# Patient Record
Sex: Female | Born: 1955 | Race: White | Hispanic: No | Smoking: Former smoker
Health system: Southern US, Community
[De-identification: ages and names within clinical notes are randomized; demographics above are authoritative.]

## PROBLEM LIST (undated history)

## (undated) DIAGNOSIS — F419 Anxiety disorder, unspecified: Secondary | ICD-10-CM

## (undated) HISTORY — PX: CHOLECYSTECTOMY: SHX55

## (undated) HISTORY — PX: APPENDECTOMY: SHX54

## (undated) HISTORY — PX: KNEE CARTILAGE SURGERY: SHX688

## (undated) HISTORY — PX: ABDOMINAL HYSTERECTOMY: SHX81

## (undated) HISTORY — DX: Anxiety disorder, unspecified: F41.9

---

## 2006-11-27 ENCOUNTER — Ambulatory Visit (HOSPITAL_COMMUNITY): Admission: RE | Admit: 2006-11-27 | Discharge: 2006-11-27 | Payer: Self-pay | Admitting: Orthopaedic Surgery

## 2010-07-31 NOTE — H&P (Signed)
Alison White, Alison White                ACCOUNT NO.:  [id number]   MEDICAL RECORD NO.:  [id number]          PATIENT TYPE:  AMB   LOCATION:  DAY                           FACILITY:  [facility]   PHYSICIAN:  J. Darreld Mclean, M.D. DATE OF BIRTH:  [birth year]   DATE OF ADMISSION:  DATE OF DISCHARGE:  LH                              HISTORY & PHYSICAL   CHIEF COMPLAINT:  I've got an ulcer on the bottom of my knee.   The patient is a 51-year-one female who I first saw in my office on  September 5 complaining of a small loose body on top of her patellar on  the left knee.  It has been present for several months, not getting any  better.  She would like to have it excised.  It bothers her when she  tries to squat or bend.  There has been no history of trauma.   PAST MEDICAL HISTORY:  Negative.   ALLERGIES:  She has no allergies.   MEDICATIONS:  She takes no medications.   HABITS:  Does not smoke.  Does not use alcohol.   PAST SURGICAL HISTORY:  She is status post hysterectomy,  cholecystectomy.   FAMILY HISTORY:  She lists no diseases that run in the family.   SOCIAL HISTORY:  She lives in [place] and is not married.   PHYSICAL EXAMINATION:  VITAL SIGNS:  BP 118/74, pulse 72, respirations  16, afebrile, height 5 feet 1 inch, 185 pounds.  GENERAL:  The patient is alert, cooperative, and oriented.  HEENT:  Negative.  NECK:  Supple.  LUNGS:  Clear to P&A.  HEART:  Regular without murmur.  ABDOMEN:  Soft, nontender without masses.  EXTREMITIES:  Gait is normal.  Range of motion of the left knee  suspicious for a small grape-sized, easily movable lesion on top of the  patella in the prepatellar area.  It is more toward the lateral side and  has a tendency to go proximal.  Extremities negative.  CNS:  Intact.  SKIN:  Intact.   IMPRESSION:  Small loose body in the prepatellar space left knee.   PLAN:  Excision of loose body.   I have discussed with the patient plan and procedure, risks,  and  imponderables.  She appears to understand and agrees to proceed as  underlined.  Labs are pending.                                            ______________________________  J. Darreld Mclean, M.D.     JWK/MEDQ  D:  11/26/2006  T:  11/26/2006  Job:  [id number]

## 2010-07-31 NOTE — Op Note (Signed)
NAMERENNAE, FERRAIOLO                ACCOUNT NO.:  [id number]   MEDICAL RECORD NO.:  [id number]          PATIENT TYPE:  AMB   LOCATION:  DAY                           FACILITY:  [facility]   PHYSICIAN:  J. Darreld Mclean, M.D. DATE OF BIRTH:  1955/07/07   DATE OF PROCEDURE:  11/27/2006  DATE OF DISCHARGE:                               OPERATIVE REPORT   PREOPERATIVE DIAGNOSIS:  Loose body, left knee, prepatellar area.   POSTOPERATIVE DIAGNOSIS:  Loose body, left knee, prepatellar area.   PROCEDURE:  Excision loose body left knee prepatellar area.   ANESTHESIA:  General.   SURGEON:  J. Darreld Mclean, M.D.   TOURNIQUET TIME:  3 minutes.   DRAINS:  None.   INDICATIONS FOR PROCEDURE:  The patient is a 55 year old female with a  small loose body in the prepatellar area of her knee that rolls around  and causes pain and tenderness, especially when she bends her knee.  She  has not improved with conservative treatment and I recommended excision.  The risks and imponderables of the procedure were discussed  preoperatively.  She appeared to understand.   DESCRIPTION OF PROCEDURE:  The patient identified the left knee as the  correct surgical site and we felt the loose body.  She was brought to  the operating room and given a general anesthetic.  A tourniquet was  placed deflated on the left upper thigh. The patient was prepped and  draped in the usual manner.  A time out identifying Ms. Belfield as the  patient.  The tourniquet was inflated.  The loose body was felt and a  small incision was made over this.  The loose body was then removed. It  was very small and sent to pathology.  The wound was reapproximated  using skin staples.  The tourniquet was deflated at 3 minutes.  A  sterile dressing was applied.  A bulky dressing was applied.  A  prescription was given for Vicodin ES for pain.  I will see her in the  office in approximately 10 days to two weeks.  If she has difficulties,  she is to  contact me at the office or hospital beeper system.           ______________________________  Shela Commons. Darreld Mclean, M.D.     JWK/MEDQ  D:  11/27/2006  T:  11/27/2006  Job:  [id number]

## 2010-12-28 LAB — DIFFERENTIAL
Eosinophils Absolute: 0.1
Eosinophils Relative: 2
Lymphs Abs: 1.3

## 2010-12-28 LAB — COMPREHENSIVE METABOLIC PANEL
ALT: 42 — ABNORMAL HIGH
AST: 36
Alkaline Phosphatase: 85
CO2: 26
Chloride: 107
GFR calc Af Amer: >60
GFR calc non Af Amer: >60
Potassium: 4.1
Sodium: 141
Total Bilirubin: 0.7

## 2010-12-28 LAB — URINALYSIS, ROUTINE W REFLEX MICROSCOPIC
Glucose, UA: NEGATIVE
Ketones, ur: NEGATIVE
Leukocytes, UA: NEGATIVE
Protein, ur: NEGATIVE

## 2010-12-28 LAB — CBC
RBC: 4.08
WBC: 6

## 2013-03-22 ENCOUNTER — Encounter (INDEPENDENT_AMBULATORY_CARE_PROVIDER_SITE_OTHER): Payer: Self-pay

## 2013-03-22 ENCOUNTER — Encounter: Payer: Self-pay | Admitting: General Practice

## 2013-03-22 ENCOUNTER — Ambulatory Visit (INDEPENDENT_AMBULATORY_CARE_PROVIDER_SITE_OTHER): Payer: 59 | Admitting: General Practice

## 2013-03-22 VITALS — BP 143/86 | HR 77 | Temp 98.5°F | Ht 60.0 in | Wt 174.0 lb

## 2013-03-22 DIAGNOSIS — R1032 Left lower quadrant pain: Secondary | ICD-10-CM

## 2013-03-22 MED ORDER — KETOROLAC TROMETHAMINE 60 MG/2ML IM SOLN
60.0000 mg | Freq: Once | INTRAMUSCULAR | Status: AC
  Administered 2013-03-22: 60 mg via INTRAMUSCULAR

## 2013-03-22 NOTE — Progress Notes (Signed)
   Subjective:    Patient ID: Alison White, female    DOB: 12/24/1955, 58 y.o.   MRN: [id number]  Abdominal Pain This is a new problem. The current episode started in the past 7 days. The onset quality is sudden. The problem occurs constantly. The problem has been unchanged. The pain is located in the LUQ. The pain is at a severity of 6/10. The pain is moderate. The quality of the pain is aching. The abdominal pain does not radiate. Pertinent negatives include no belching, constipation, diarrhea, dysuria, fever, flatus, frequency, hematuria, nausea or vomiting. The pain is aggravated by certain positions and movement. The pain is relieved by nothing. She has tried oral narcotic analgesics for the symptoms.  Patient was seen at [facility] on 03/18/13 for low back pain and diagnosed with acute pyelonephritis. She was given IV antibiotics during the visit. Reports some relief for 24 hours and now left abdominal pain has developed.     Review of Systems  Constitutional: Negative for fever and chills.  Respiratory: Negative for chest tightness and shortness of breath.   Cardiovascular: Negative for chest pain and palpitations.  Gastrointestinal: Positive for abdominal pain. Negative for nausea, vomiting, diarrhea, constipation and flatus.  Genitourinary: Negative for dysuria, frequency, hematuria and difficulty urinating.  All other systems reviewed and are negative.       Objective:   Physical Exam  Constitutional: She is oriented to person, place, and time. She appears well-developed and well-nourished.  Cardiovascular: Normal rate, regular rhythm and normal heart sounds.   Pulmonary/Chest: Effort normal and breath sounds normal. No respiratory distress. She exhibits no tenderness.  Abdominal: Soft. Bowel sounds are normal. There is tenderness.  Tenderness to left upper lateral abdomen, with swelling.   Neurological: She is alert and oriented to person, place, and time.  Skin: Skin is  warm and dry.  Psychiatric: She has a normal mood and affect.          Assessment & Plan:  1. Abdominal pain, left lower quadrant - US Abdomen Complete; Future (scheduled for today, after leaving office) -Patient verbalized understanding and in agreement with having [place] of abdomen  - ketorolac (TORADOL) injection 60 mg; Inject 2 mLs (60 mg total) into the muscle once. Erby Pian, FNP-C

## 2013-03-22 NOTE — Patient Instructions (Addendum)

## 2013-03-23 ENCOUNTER — Telehealth: Payer: Self-pay | Admitting: General Practice

## 2013-03-24 NOTE — Telephone Encounter (Signed)
Patient talked to [place] last night and mae they discussed with her

## 2013-03-26 ENCOUNTER — Ambulatory Visit (INDEPENDENT_AMBULATORY_CARE_PROVIDER_SITE_OTHER): Payer: 59

## 2013-03-26 ENCOUNTER — Telehealth: Payer: Self-pay | Admitting: General Practice

## 2013-03-26 ENCOUNTER — Ambulatory Visit (INDEPENDENT_AMBULATORY_CARE_PROVIDER_SITE_OTHER): Payer: 59 | Admitting: General Practice

## 2013-03-26 ENCOUNTER — Encounter: Payer: Self-pay | Admitting: General Practice

## 2013-03-26 VITALS — BP 143/83 | HR 61 | Temp 99.9°F | Wt 175.0 lb

## 2013-03-26 DIAGNOSIS — R1012 Left upper quadrant pain: Secondary | ICD-10-CM

## 2013-03-26 DIAGNOSIS — K5900 Constipation, unspecified: Secondary | ICD-10-CM

## 2013-03-26 LAB — POCT CBC
Granulocyte percent: 69.1 %G (ref 37–80)
HCT, POC: 42.4 % (ref 37.7–47.9)
HEMOGLOBIN: 13 g/dL (ref 12.2–16.2)
Lymph, poc: 1.7 (ref 0.6–3.4)
MCH, POC: 30.8 pg (ref 27–31.2)
MCHC: 30.7 g/dL — AB (ref 31.8–35.4)
MCV: 100.2 fL — AB (ref 80–97)
MPV: 7.7 fL (ref 0–99.8)
PLATELET COUNT, POC: 291 10*3/uL (ref 142–424)
POC GRANULOCYTE: 4.6 (ref 2–6.9)
POC LYMPH %: 25.6 % (ref 10–50)
RBC: 4.2 M/uL (ref 4.04–5.48)
RDW, POC: 12.8 %
WBC: 6.6 10*3/uL (ref 4.6–10.2)

## 2013-03-26 LAB — POCT URINALYSIS DIPSTICK
BILIRUBIN UA: NEGATIVE
Glucose, UA: NEGATIVE
Ketones, UA: NEGATIVE
Leukocytes, UA: NEGATIVE
NITRITE UA: NEGATIVE
Protein, UA: NEGATIVE
RBC UA: NEGATIVE
SPEC GRAV UA: 1.01
Urobilinogen, UA: NEGATIVE
pH, UA: 7.5

## 2013-03-26 LAB — POCT UA - MICROSCOPIC ONLY
BACTERIA, U MICROSCOPIC: NEGATIVE
Casts, Ur, LPF, POC: NEGATIVE
Crystals, Ur, HPF, POC: NEGATIVE
MUCUS UA: NEGATIVE
RBC, urine, microscopic: NEGATIVE
WBC, Ur, HPF, POC: NEGATIVE
Yeast, UA: NEGATIVE

## 2013-03-26 IMAGING — CR DG ABDOMEN 1V
1 series · 1 of 1 positions shown · non-contrast
Comparison: None.

CLINICAL DATA: Abdominal pain

EXAM:
ABDOMEN - 1 VIEW

[view not recorded]
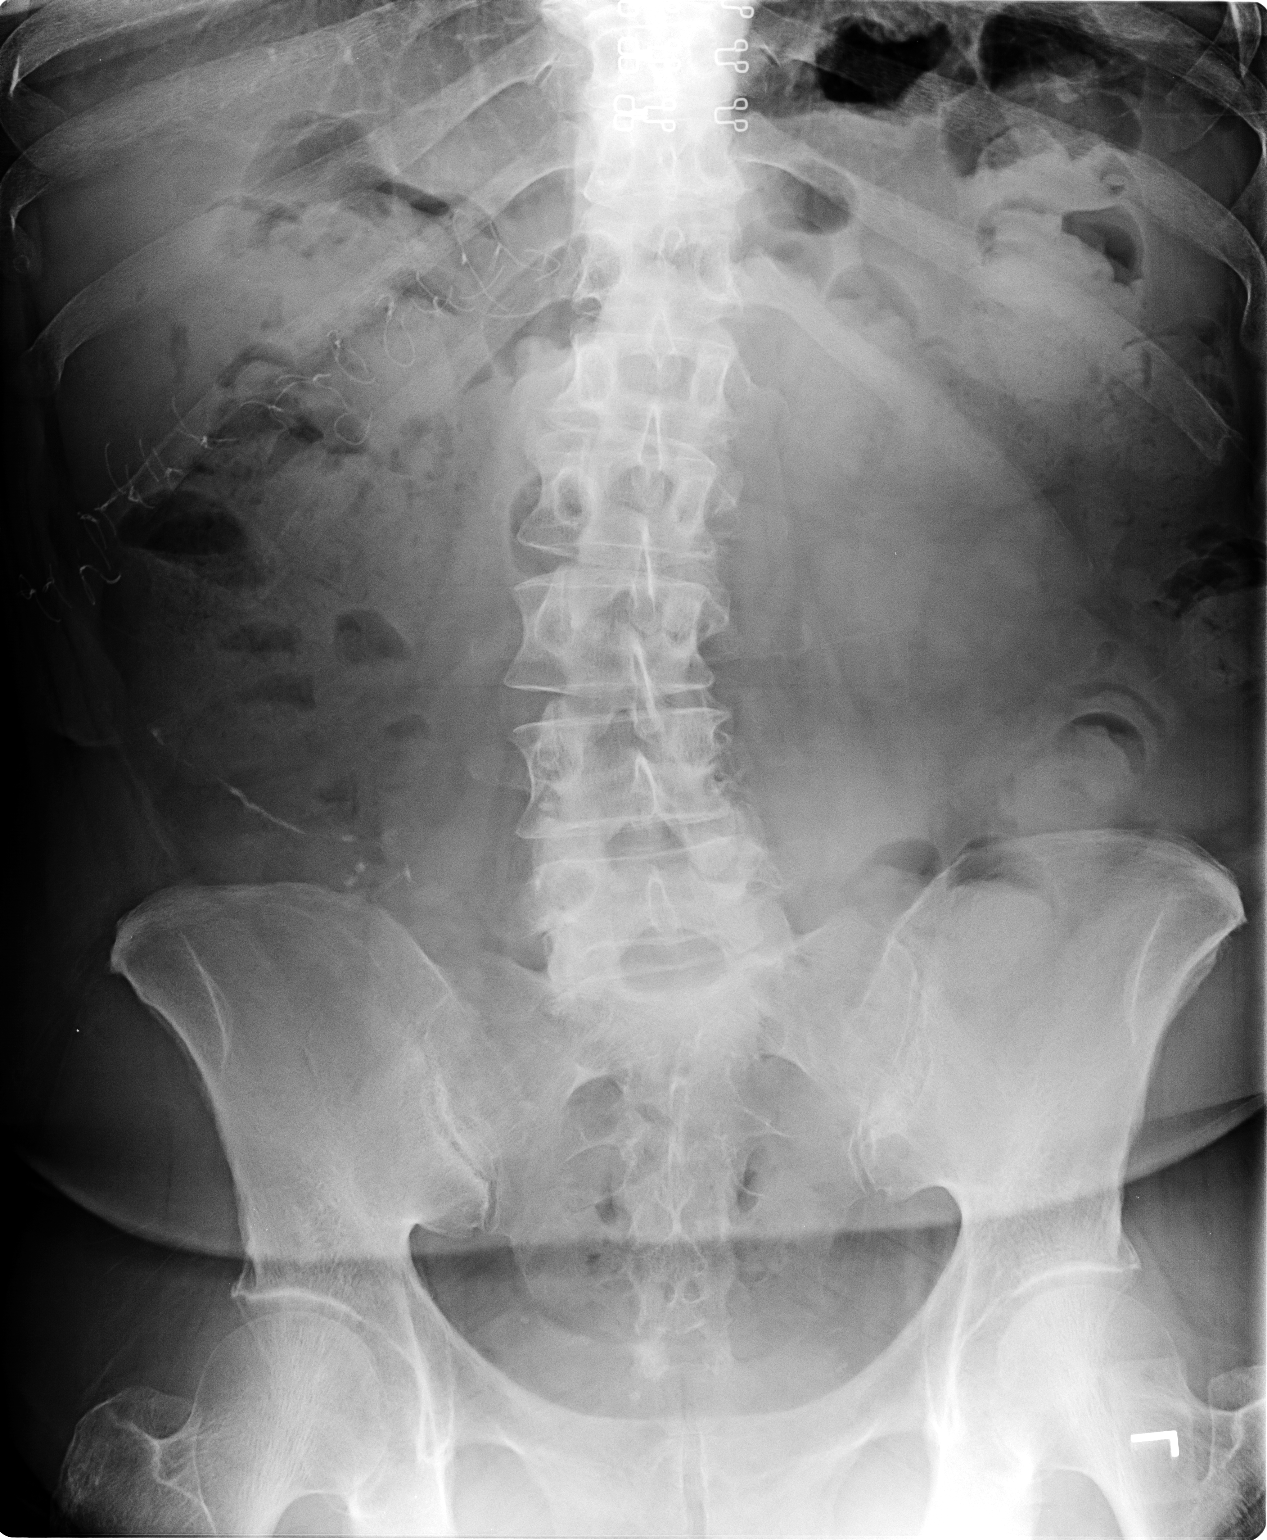

[1 of 1 positions shown; findings below may reference images not displayed]

FINDINGS: Scattered large and small bowel gas is noted. Fecal material is
noted throughout the colon. Postsurgical changes are seen.
Degenerative changes of the lumbar spine are noted. No abnormal mass
or abnormal calcifications are noted.
IMPRESSION: No acute abnormality seen.

## 2013-03-26 NOTE — Progress Notes (Signed)
   Subjective:    Patient ID: Alison White, female    DOB: July 12, 1955, 58 y.o.   MRN: [id number]  Abdominal Pain This is a recurrent problem. The current episode started 1 to 4 weeks ago. The onset quality is sudden. The problem occurs constantly. The pain is located in the LLQ. The pain is at a severity of 7/10. The quality of the pain is aching. The abdominal pain radiates to the LUQ. Associated symptoms include nausea. Pertinent negatives include no constipation, diarrhea, dysuria, fever, frequency, hematochezia, hematuria or vomiting. The pain is aggravated by movement. The pain is relieved by nothing. She has tried oral narcotic analgesics for the symptoms. The treatment provided no relief. There is no history of GERD or ulcerative colitis.      Review of Systems  Constitutional: Negative for fever and chills.  Respiratory: Negative for chest tightness and shortness of breath.   Cardiovascular: Negative for chest pain and palpitations.  Gastrointestinal: Positive for nausea and abdominal pain. Negative for vomiting, diarrhea, constipation, blood in stool and hematochezia.  Genitourinary: Negative for dysuria, urgency, frequency and hematuria.       Objective:   Physical Exam  Constitutional: She is oriented to person, place, and time. She appears well-developed and well-nourished.  Cardiovascular: Normal rate, regular rhythm and normal heart sounds.   Pulmonary/Chest: Effort normal and breath sounds normal. No respiratory distress. She exhibits no tenderness.  Abdominal: Soft. Bowel sounds are normal. She exhibits no distension. There is tenderness. There is no rebound.  Tenderness to left upper lateral abdomen, with swelling.   Neurological: She is alert and oriented to person, place, and time.  Skin: Skin is warm and dry.  Psychiatric: She has a normal mood and affect.      [facility] reading (PRIMARY) by Erby Pian, FNP-C, large amount of stool in colon.     Assessment &  Plan:  1. Abdominal pain, LUQ (left upper quadrant)  - H Pylori, IGM, IGG, IGA AB - DG Abd 1 View; Future - POCT CBC - POCT UA - Microscopic Only - POCT urinalysis dipstick  2. Constipation -Miralax 17grams daily, for 1-4 days, until bowel movement  Increase fluid intake (water) Increase fiber in diet (fruits, vegetables, whole grains) Take stool softner daily RTO if symptoms worsen or unresolved Patient education discussed and provided, constipation may seek emergency medical treatment Patient verbalized understanding Erby Pian, FNP-C

## 2013-03-26 NOTE — Telephone Encounter (Signed)
appt scheduled

## 2013-03-26 NOTE — Patient Instructions (Signed)

## 2013-03-31 LAB — H PYLORI, IGM, IGG, IGA AB
H Pylori IgG: <0.9 U/mL (ref 0.0–0.8)
H. pylori, IgA Abs: <9 units (ref 0.0–8.9)
H. pylori, IgM Abs: <9 units (ref 0.0–8.9)

## 2013-04-02 NOTE — Telephone Encounter (Signed)
Labs are o.k., please call if you need to discuss.

## 2013-04-05 NOTE — Telephone Encounter (Signed)
Message copied by Thana Ates on Mon Apr 05, 2013 12:04 PM ------      Message from: Erby Pian      Created: Fri Apr 02, 2013  7:52 AM       Please inform labs were within limits. ------

## 2013-04-06 NOTE — Telephone Encounter (Signed)
Pt aware of labs ok.  rs

## 2013-04-21 ENCOUNTER — Encounter: Payer: Self-pay | Admitting: General Practice

## 2013-04-21 ENCOUNTER — Ambulatory Visit (INDEPENDENT_AMBULATORY_CARE_PROVIDER_SITE_OTHER): Payer: 59

## 2013-04-21 ENCOUNTER — Encounter (INDEPENDENT_AMBULATORY_CARE_PROVIDER_SITE_OTHER): Payer: Self-pay

## 2013-04-21 ENCOUNTER — Ambulatory Visit (INDEPENDENT_AMBULATORY_CARE_PROVIDER_SITE_OTHER): Payer: 59 | Admitting: General Practice

## 2013-04-21 VITALS — BP 135/86 | HR 71 | Temp 99.4°F | Ht 60.0 in | Wt 171.0 lb

## 2013-04-21 DIAGNOSIS — R109 Unspecified abdominal pain: Secondary | ICD-10-CM

## 2013-04-21 DIAGNOSIS — Z1211 Encounter for screening for malignant neoplasm of colon: Secondary | ICD-10-CM

## 2013-04-21 NOTE — Patient Instructions (Signed)
Abdominal Pain, Adult °Many things can cause abdominal pain. Usually, abdominal pain is not caused by a disease and will improve without treatment. It can often be observed and treated at home. Your health care provider will do a physical exam and possibly order blood tests and X-rays to help determine the seriousness of your pain. However, in many cases, more time must pass before a clear cause of the pain can be found. Before that point, your health care provider may not know if you need more testing or further treatment. °HOME CARE INSTRUCTIONS  °Monitor your abdominal pain for any changes. The following actions may help to alleviate any discomfort you are experiencing: °· Only take over-the-counter or prescription medicines as directed by your health care provider. °· Do not take laxatives unless directed to do so by your health care provider. °· Try a clear liquid diet (broth, tea, or water) as directed by your health care provider. Slowly move to a bland diet as tolerated. °SEEK MEDICAL CARE IF: °· You have unexplained abdominal pain. °· You have abdominal pain associated with nausea or diarrhea. °· You have pain when you urinate or have a bowel movement. °· You experience abdominal pain that wakes you in the night. °· You have abdominal pain that is worsened or improved by eating food. °· You have abdominal pain that is worsened with eating fatty foods. °SEEK IMMEDIATE MEDICAL CARE IF:  °· Your pain does not go away within 2 hours. °· You have a fever. °· You keep throwing up (vomiting). °· Your pain is felt only in portions of the abdomen, such as the right side or the left lower portion of the abdomen. °· You pass bloody or black tarry stools. °MAKE SURE YOU: °· Understand these instructions.   °· Will watch your condition.   °· Will get help right away if you are not doing well or get worse.   °Document Released: 12/12/2004 Document Revised: 12/23/2012 Document Reviewed: 11/11/2012 °ExitCare® Patient  Information ©2014 [employer]. ° °

## 2013-04-21 NOTE — Progress Notes (Signed)
   Subjective:    Patient ID: Alison White, female    DOB: Dec 24, 1955, 58 y.o.   MRN: [id number]  HPI Patient presents today for follow up abdominal pain. She reports pain in left abdominal area has improved, but still present. Mild swelling still present on left abdomen area. Colonoscopy performed in 2004 and suggested every 10 years. Family history of colon cancer.     Review of Systems  Constitutional: Negative for fever and chills.  Respiratory: Negative for chest tightness and shortness of breath.   Cardiovascular: Negative for chest pain and palpitations.  Gastrointestinal: Positive for abdominal pain. Negative for nausea, vomiting, diarrhea, constipation, blood in stool and hematochezia.  Genitourinary: Negative for dysuria, urgency, frequency and hematuria.       Objective:   Physical Exam  Constitutional: She is oriented to person, place, and time. She appears well-developed and well-nourished.  Cardiovascular: Normal rate, regular rhythm and normal heart sounds.   Pulmonary/Chest: Effort normal and breath sounds normal. No respiratory distress. She exhibits no tenderness.  Abdominal: Soft. Bowel sounds are normal. She exhibits no distension. There is tenderness. There is no rebound.  Tenderness to left upper lateral abdomen, with swelling.   Neurological: She is alert and oriented to person, place, and time.  Skin: Skin is warm and dry.  Psychiatric: She has a normal mood and affect.    [facility] reading (PRIMARY) by Erby Pian, FNP-C, stool and free air noted in colon.       Assessment & Plan:  1. Abdominal pain - DG Abd 1 View; Future - Ambulatory referral to Gastroenterology -continue healthy eating and adequate fluids Patient verbalized understanding Erby Pian, FNP-C

## 2013-04-22 DIAGNOSIS — R109 Unspecified abdominal pain: Secondary | ICD-10-CM | POA: Insufficient documentation

## 2013-08-23 ENCOUNTER — Ambulatory Visit (INDEPENDENT_AMBULATORY_CARE_PROVIDER_SITE_OTHER): Payer: 59 | Admitting: General Practice

## 2013-08-23 ENCOUNTER — Telehealth: Payer: Self-pay | Admitting: Family Medicine

## 2013-08-23 ENCOUNTER — Encounter: Payer: Self-pay | Admitting: General Practice

## 2013-08-23 VITALS — BP 125/81 | HR 73 | Temp 99.0°F | Ht 60.0 in | Wt 168.6 lb

## 2013-08-23 DIAGNOSIS — R5383 Other fatigue: Secondary | ICD-10-CM

## 2013-08-23 DIAGNOSIS — H538 Other visual disturbances: Secondary | ICD-10-CM

## 2013-08-23 DIAGNOSIS — R531 Weakness: Secondary | ICD-10-CM

## 2013-08-23 DIAGNOSIS — R42 Dizziness and giddiness: Secondary | ICD-10-CM

## 2013-08-23 DIAGNOSIS — R5381 Other malaise: Secondary | ICD-10-CM

## 2013-08-23 LAB — POCT CBC
Granulocyte percent: 69.1 %G (ref 37–80)
HEMATOCRIT: 39.8 % (ref 37.7–47.9)
HEMOGLOBIN: 13.1 g/dL (ref 12.2–16.2)
Lymph, poc: 1.8 (ref 0.6–3.4)
MCH, POC: 33.3 pg — AB (ref 27–31.2)
MCHC: 32.9 g/dL (ref 31.8–35.4)
MCV: 101.2 fL — AB (ref 80–97)
MPV: 7.7 fL (ref 0–99.8)
POC GRANULOCYTE: 4.4 (ref 2–6.9)
POC LYMPH PERCENT: 28.4 %L (ref 10–50)
Platelet Count, POC: 256 10*3/uL (ref 142–424)
RBC: 3.9 M/uL — AB (ref 4.04–5.48)
RDW, POC: 13.3 %
WBC: 6.4 10*3/uL (ref 4.6–10.2)

## 2013-08-23 LAB — GLUCOSE, POCT (MANUAL RESULT ENTRY): POC GLUCOSE: 100 mg/dL — AB (ref 70–99)

## 2013-08-23 NOTE — Patient Instructions (Signed)

## 2013-08-23 NOTE — Progress Notes (Signed)
   Subjective:    Patient ID: Alison White, female    DOB: 1955/11/30, 58 y.o.   MRN: [id number]  Dizziness This is a new problem. The current episode started yesterday. The problem has been rapidly improving. Associated symptoms include a visual change. Pertinent negatives include no abdominal pain, chest pain, chills, congestion, coughing, fatigue, fever, headaches, rash, sore throat, vertigo, vomiting or weakness. Nothing aggravates the symptoms. She has tried nothing for the symptoms.      Review of Systems  Constitutional: Negative for fever, chills, activity change, appetite change and fatigue.  HENT: Negative for congestion, postnasal drip, rhinorrhea, sinus pressure and sore throat.   Eyes: Negative for visual disturbance.  Respiratory: Negative for cough, chest tightness, shortness of breath and wheezing.   Cardiovascular: Negative for chest pain and palpitations.  Gastrointestinal: Negative for vomiting, abdominal pain, diarrhea, constipation and blood in stool.  Genitourinary: Negative for difficulty urinating.  Skin: Negative for rash.  Neurological: Positive for dizziness. Negative for vertigo, weakness and headaches.       Objective:   Physical Exam  Constitutional: She is oriented to person, place, and time. She appears well-developed and well-nourished.  HENT:  Head: Normocephalic and atraumatic.  Right Ear: External ear normal.  Left Ear: External ear normal.  Mouth/Throat: Oropharynx is clear and moist.  Eyes: EOM are normal. Pupils are equal, round, and reactive to light.  Neck: Normal range of motion. Neck supple. No thyromegaly present.  Cardiovascular: Normal rate, regular rhythm and normal heart sounds.   Pulmonary/Chest: Effort normal and breath sounds normal. No respiratory distress. She exhibits no tenderness.  Abdominal: Soft. Bowel sounds are normal. She exhibits no distension. There is no tenderness.  Lymphadenopathy:    She has no cervical  adenopathy.  Neurological: She is alert and oriented to person, place, and time.  Skin: Skin is warm and dry.  Psychiatric: She has a normal mood and affect.    Results for orders placed in visit on 08/23/13  GLUCOSE, POCT (MANUAL RESULT ENTRY)      Result Value Ref Range   POC Glucose 100 (*) 70 - 99 mg/dl  POCT CBC      Result Value Ref Range   WBC 6.4  4.6 - 10.2 K/uL   Lymph, poc 1.8  0.6 - 3.4   POC LYMPH PERCENT 28.4  10 - 50 %L   POC Granulocyte 4.4  2 - 6.9   Granulocyte percent 69.1  37 - 80 %G   RBC 3.9 (*) 4.04 - 5.48 M/uL   Hemoglobin 13.1  12.2 - 16.2 g/dL   HCT, POC 39.8  37.7 - 47.9 %   MCV 101.2 (*) 80 - 97 fL   MCH, POC 33.3 (*) 27 - 31.2 pg   MCHC 32.9  31.8 - 35.4 g/dL   RDW, POC 13.3     Platelet Count, POC 256.0  142 - 424 K/uL   MPV 7.7  0 - 99.8 fL         Assessment & Plan:  1. Dizziness,  Blurred vision, 3. Weakness - POCT glucose (manual entry) - POCT CBC - BMP8+EGFR -discussed and provided educational material on dizziness -discussed fall prevention -RTO or seek emergency medical treatment if symptoms return Patient verbalized understanding Erby Pian, FNP-C

## 2013-08-23 NOTE — Telephone Encounter (Signed)
appt scheduled

## 2013-08-24 LAB — BMP8+EGFR
BUN/Creatinine Ratio: 11 (ref 9–23)
BUN: 8 mg/dL (ref 6–24)
CO2: 22 mmol/L (ref 18–29)
Calcium: 9.6 mg/dL (ref 8.7–10.2)
Chloride: 104 mmol/L (ref 97–108)
Creatinine, Ser: 0.7 mg/dL (ref 0.57–1.00)
GFR calc Af Amer: 111 mL/min/{1.73_m2} (ref >59)
GFR, EST NON AFRICAN AMERICAN: 96 mL/min/{1.73_m2} (ref >59)
GLUCOSE: 86 mg/dL (ref 65–99)
Potassium: 4.1 mmol/L (ref 3.5–5.2)
Sodium: 145 mmol/L — ABNORMAL HIGH (ref 134–144)

## 2014-08-16 ENCOUNTER — Encounter: Payer: Self-pay | Admitting: Nurse Practitioner

## 2014-08-16 ENCOUNTER — Ambulatory Visit (INDEPENDENT_AMBULATORY_CARE_PROVIDER_SITE_OTHER): Payer: 59 | Admitting: Nurse Practitioner

## 2014-08-16 VITALS — BP 154/92 | HR 75 | Temp 97.7°F | Ht 60.0 in | Wt 171.0 lb

## 2014-08-16 DIAGNOSIS — M5432 Sciatica, left side: Secondary | ICD-10-CM

## 2014-08-16 MED ORDER — TRAMADOL HCL 50 MG PO TABS: 50.0000 mg | ORAL_TABLET | Freq: Three times a day (TID) | ORAL | Status: DC | PRN

## 2014-08-16 MED ORDER — METHYLPREDNISOLONE ACETATE 80 MG/ML IJ SUSP
80.0000 mg | Freq: Once | INTRAMUSCULAR | Status: AC
  Administered 2014-08-16: 80 mg via INTRAMUSCULAR

## 2014-08-16 MED ORDER — PREDNISONE 20 MG PO TABS: ORAL_TABLET | ORAL | Status: DC

## 2014-08-16 NOTE — Progress Notes (Signed)
   Subjective:    Patient ID: Alison White, female    DOB: Jan 01, 1956, 59 y.o.   MRN: [id number]  HPI Patient in c/o left buttocks and left leg pain- was pushing mowing Friday on a bank and pain started while she was mowing- pain has not improved- Pain is on the anterior thigh and posterior calf. When she tries to walk the pain is in her left buttocks. Rates pain 8/10. Pain is c0nstant in thigh and calf and only when she walks in left buttocks. Denies any weakness. Lying down is the only thing that helps with pain.    Review of Systems  Constitutional: Negative.   HENT: Negative.   Respiratory: Negative.   Cardiovascular: Negative.   Genitourinary: Negative.   Neurological: Negative.   Psychiatric/Behavioral: Negative.   All other systems reviewed and are negative.      Objective:   Physical Exam  Constitutional: She is oriented to person, place, and time. She appears well-developed and well-nourished.  Cardiovascular: Normal rate, regular rhythm and normal heart sounds.   Musculoskeletal: She exhibits no edema.  Positive SLR at 90 degrees in left leg- Pain in  Left buttocks on rotation of lumbar spine to the right. Motor strength and sensation distally intact Negative homan sign   Neurological: She is alert and oriented to person, place, and time.  Skin: Skin is warm and dry.  Psychiatric: She has a normal mood and affect. Her behavior is normal. Judgment and thought content normal.   BP 154/92 mmHg  Pulse 75  Temp(Src) 97.7 F (36.5 C) (Oral)  Ht 5' (1.524 m)  Wt 171 lb (77.565 kg)  BMI 33.40 kg/m2        Assessment & Plan:   1. Sciatica, left    Meds ordered this encounter  Medications  . methylPREDNISolone acetate (DEPO-MEDROL) injection 80 mg    Sig:   . predniSONE (DELTASONE) 20 MG tablet    Sig: 2 po at sametime daily for 5 days- start tomorrow    Dispense:  10 tablet    Refill:  0    Order Specific Question:  Supervising Provider    Answer:  Chipper Herb [[id number]]  . traMADol (ULTRAM) 50 MG tablet    Sig: Take 1 tablet (50 mg total) by mouth every 8 (eight) hours as needed.    Dispense:  30 tablet    Refill:  0    Order Specific Question:  Supervising Provider    Answer:  Chipper Herb [[id number]]   Moist heat Rest If no improvement in 2-3 days RTO If have calf swelling may need doppler study  Alison Hassell Done, FNP

## 2014-08-16 NOTE — Patient Instructions (Signed)
Sciatica Sciatica is pain, weakness, numbness, or tingling along the path of the sciatic nerve. The nerve starts in the lower back and runs down the back of each leg. The nerve controls the muscles in the lower leg and in the back of the knee, while also providing sensation to the back of the thigh, lower leg, and the sole of your foot. Sciatica is a symptom of another medical condition. For instance, nerve damage or certain conditions, such as a herniated disk or bone spur on the spine, pinch or put pressure on the sciatic nerve. This causes the pain, weakness, or other sensations normally associated with sciatica. Generally, sciatica only affects one side of the body. CAUSES   Herniated or slipped disc.  Degenerative disk disease.  A pain disorder involving the narrow muscle in the buttocks (piriformis syndrome).  Pelvic injury or fracture.  Pregnancy.  Tumor (rare). SYMPTOMS  Symptoms can vary from mild to very severe. The symptoms usually travel from the low back to the buttocks and down the back of the leg. Symptoms can include:  Mild tingling or dull aches in the lower back, leg, or hip.  Numbness in the back of the calf or sole of the foot.  Burning sensations in the lower back, leg, or hip.  Sharp pains in the lower back, leg, or hip.  Leg weakness.  Severe back pain inhibiting movement. These symptoms may get worse with coughing, sneezing, laughing, or prolonged sitting or standing. Also, being overweight may worsen symptoms. DIAGNOSIS  Your caregiver will perform a physical exam to look for common symptoms of sciatica. He or she may ask you to do certain movements or activities that would trigger sciatic nerve pain. Other tests may be performed to find the cause of the sciatica. These may include:  Blood tests.  X-rays.  Imaging tests, such as an MRI or CT scan. TREATMENT  Treatment is directed at the cause of the sciatic pain. Sometimes, treatment is not necessary  and the pain and discomfort goes away on its own. If treatment is needed, your caregiver may suggest:  Over-the-counter medicines to relieve pain.  Prescription medicines, such as anti-inflammatory medicine, muscle relaxants, or narcotics.  Applying heat or ice to the painful area.  Steroid injections to lessen pain, irritation, and inflammation around the nerve.  Reducing activity during periods of pain.  Exercising and stretching to strengthen your abdomen and improve flexibility of your spine. Your caregiver may suggest losing weight if the extra weight makes the back pain worse.  Physical therapy.  Surgery to eliminate what is pressing or pinching the nerve, such as a bone spur or part of a herniated disk. HOME CARE INSTRUCTIONS   Only take over-the-counter or prescription medicines for pain or discomfort as directed by your caregiver.  Apply ice to the affected area for 20 minutes, 3-4 times a day for the first 48-72 hours. Then try heat in the same way.  Exercise, stretch, or perform your usual activities if these do not aggravate your pain.  Attend physical therapy sessions as directed by your caregiver.  Keep all follow-up appointments as directed by your caregiver.  Do not wear high heels or shoes that do not provide proper support.  Check your mattress to see if it is too soft. A firm mattress may lessen your pain and discomfort. SEEK IMMEDIATE MEDICAL CARE IF:   You lose control of your bowel or bladder (incontinence).  You have increasing weakness in the lower back, pelvis, buttocks,   or legs.  You have redness or swelling of your back.  You have a burning sensation when you urinate.  You have pain that gets worse when you lie down or awakens you at night.  Your pain is worse than you have experienced in the past.  Your pain is lasting longer than 4 weeks.  You are suddenly losing weight without reason. MAKE SURE YOU:  Understand these  instructions.  Will watch your condition.  Will get help right away if you are not doing well or get worse. Document Released: 02/26/2001 Document Revised: 09/03/2011 Document Reviewed: 07/14/2011 ExitCare Patient Information 2015 [employer]. This information is not intended to replace advice given to you by your health care provider. Make sure you discuss any questions you have with your health care provider.  

## 2014-08-17 NOTE — Addendum Note (Signed)
Addended by: Chevis Pretty on: 08/17/2014 10:36 AM   Modules accepted: Level of Service

## 2014-10-10 ENCOUNTER — Ambulatory Visit (INDEPENDENT_AMBULATORY_CARE_PROVIDER_SITE_OTHER): Payer: 59

## 2014-10-10 ENCOUNTER — Encounter: Payer: Self-pay | Admitting: *Deleted

## 2014-10-10 ENCOUNTER — Ambulatory Visit (INDEPENDENT_AMBULATORY_CARE_PROVIDER_SITE_OTHER): Payer: 59 | Admitting: Family Medicine

## 2014-10-10 ENCOUNTER — Encounter: Payer: Self-pay | Admitting: Family Medicine

## 2014-10-10 VITALS — BP 150/92 | HR 65 | Temp 97.3°F | Ht 60.0 in | Wt 171.0 lb

## 2014-10-10 DIAGNOSIS — M545 Low back pain: Secondary | ICD-10-CM | POA: Diagnosis not present

## 2014-10-10 MED ORDER — METHYLPREDNISOLONE ACETATE 80 MG/ML IJ SUSP
60.0000 mg | Freq: Once | INTRAMUSCULAR | Status: AC
  Administered 2014-10-10: 60 mg via INTRAMUSCULAR

## 2014-10-10 MED ORDER — TRAMADOL HCL 50 MG PO TABS: 50.0000 mg | ORAL_TABLET | Freq: Two times a day (BID) | ORAL | Status: DC | PRN

## 2014-10-10 MED ORDER — PREDNISONE 10 MG PO TABS: ORAL_TABLET | ORAL | Status: DC

## 2014-10-10 NOTE — Addendum Note (Signed)
Addended by: Zannie Cove on: 10/10/2014 05:59 PM   Modules accepted: Orders

## 2014-10-10 NOTE — Progress Notes (Signed)
   Subjective:    Patient ID: Alison White, female    DOB: 1955/09/27, 59 y.o.   MRN: [id number]  HPI Patient here today for low back pain and problems with her leg. This is the second episode since the end of May. This time she simply got off the couch and twisted a little bit and had severe pain that is constant from the low back down the  side of the left thigh. The pain is severe.       Patient Active Problem List   Diagnosis Date Noted  . Abdominal pain 04/22/2013   Outpatient Encounter Prescriptions as of 10/10/2014  Medication Sig  . BLACK COHOSH PO Take by mouth.  . traMADol (ULTRAM) 50 MG tablet Take 1 tablet (50 mg total) by mouth every 8 (eight) hours as needed.  . [DISCONTINUED] predniSONE (DELTASONE) 20 MG tablet 2 po at sametime daily for 5 days- start tomorrow   No facility-administered encounter medications on file as of 10/10/2014.     Review of Systems  Constitutional: Negative.   HENT: Negative.   Eyes: Negative.   Respiratory: Negative.   Cardiovascular: Negative.   Gastrointestinal: Negative.   Endocrine: Negative.   Genitourinary: Negative.   Musculoskeletal: Positive for back pain (low).  Skin: Negative.   Allergic/Immunologic: Negative.   Neurological: Negative.   Hematological: Negative.   Psychiatric/Behavioral: Negative.        Objective:   Physical Exam  Constitutional: She is oriented to person, place, and time. She appears well-developed and well-nourished. She appears distressed.  HENT:  Head: Normocephalic.  Eyes: Conjunctivae and EOM are normal. Pupils are equal, round, and reactive to light. Right eye exhibits no discharge. Left eye exhibits no discharge.  Neck: Normal range of motion.  Musculoskeletal: She exhibits no edema or tenderness.  With leg raising on the left side there is increased pain in the low back and lateral 5 above 45 raising of the leg.  Neurological: She is alert and oriented to person, place, and time. She has  normal reflexes.  Skin: Skin is warm and dry. No rash noted. No erythema.  Psychiatric: She has a normal mood and affect. Her behavior is normal. Thought content normal.  Nursing note and vitals reviewed.  BP 150/92 mmHg  Pulse 65  Temp(Src) 97.3 F (36.3 C) (Oral)  Ht 5' (1.524 m)  Wt 171 lb (77.565 kg)  BMI 33.40 kg/m2 [facility] reading (PRIMARY) by  Dr.Lauralei Clouse-LS spine--degenerative changes L5-S1                                 60 of Depo-Medrol IM       Assessment & Plan:  1. 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  Patient Instructions  Take tramadol as needed for pain every 6-8 hours We will arrange for you to have an MRI because of the severity of the pain and the recurrence of this pain Avoid any lifting pushing pulling Use warm wet compresses to low back and left lateral thigh area 20 minutes 3 or 4 times daily   Arrie Senate MD

## 2014-10-10 NOTE — Patient Instructions (Addendum)
Take tramadol as needed for pain every 6-8 hours We will arrange for you to have an MRI because of the severity of the pain and the recurrence of this pain Avoid any lifting pushing pulling Use warm wet compresses to low back and left lateral thigh area 20 minutes 3 or 4 times daily

## 2014-10-19 ENCOUNTER — Telehealth: Payer: Self-pay | Admitting: Family Medicine

## 2014-10-19 NOTE — Telephone Encounter (Signed)
Pt aware and work note up front

## 2014-10-19 NOTE — Telephone Encounter (Signed)
Pt has rck back pain appt on Fri. Her MRI is not sched until 8/11, should she keep Fridays appt?  Also she went back to work [employer], finished steroids tues, but she is not able to work 9hrs a day. Can she get a refill on the steroids or be put out of work till MRI?

## 2014-10-19 NOTE — Telephone Encounter (Signed)
Pt aware that we will reschedule appt until Friday's MRI is done.   DWM,  Can we do another round of steroid or put her out of work.??

## 2014-10-19 NOTE — Telephone Encounter (Signed)
The patient out of work until there is a good diagnosis with the MRI

## 2014-10-21 ENCOUNTER — Ambulatory Visit: Payer: 59 | Admitting: Family Medicine

## 2014-10-27 ENCOUNTER — Ambulatory Visit (HOSPITAL_COMMUNITY)
Admission: RE | Admit: 2014-10-27 | Discharge: 2014-10-27 | Disposition: A | Discharge status: Home / Self Care | Payer: 59 | Source: Ambulatory Visit | Attending: Family Medicine | Admitting: Family Medicine

## 2014-10-27 DIAGNOSIS — M545 Low back pain: Secondary | ICD-10-CM | POA: Diagnosis present

## 2014-10-27 DIAGNOSIS — M79605 Pain in left leg: Secondary | ICD-10-CM | POA: Diagnosis not present

## 2014-10-27 DIAGNOSIS — R200 Anesthesia of skin: Secondary | ICD-10-CM | POA: Diagnosis not present

## 2014-10-27 DIAGNOSIS — M4806 Spinal stenosis, lumbar region: Secondary | ICD-10-CM | POA: Insufficient documentation

## 2014-10-27 DIAGNOSIS — M5126 Other intervertebral disc displacement, lumbar region: Secondary | ICD-10-CM | POA: Insufficient documentation

## 2014-10-28 ENCOUNTER — Telehealth: Payer: Self-pay | Admitting: Family Medicine

## 2014-10-28 NOTE — Telephone Encounter (Signed)
Pt aware of MRI results but was given an appointmnet with Dr. Laurance Flatten for 10:15 on Tuesday 11/01/14 for further review

## 2014-11-01 ENCOUNTER — Ambulatory Visit: Payer: 59 | Admitting: Family Medicine

## 2014-11-01 ENCOUNTER — Other Ambulatory Visit: Payer: Self-pay | Admitting: *Deleted

## 2014-11-01 DIAGNOSIS — M48061 Spinal stenosis, lumbar region without neurogenic claudication: Secondary | ICD-10-CM

## 2014-11-01 DIAGNOSIS — M5126 Other intervertebral disc displacement, lumbar region: Secondary | ICD-10-CM

## 2014-11-01 DIAGNOSIS — M5136 Other intervertebral disc degeneration, lumbar region: Secondary | ICD-10-CM

## 2014-11-01 MED ORDER — LORAZEPAM 0.5 MG PO TABS: ORAL_TABLET | ORAL | Status: DC

## 2014-11-04 ENCOUNTER — Telehealth: Payer: Self-pay | Admitting: Family Medicine

## 2014-11-05 NOTE — Telephone Encounter (Signed)
Pt needs disability paperwork filled out She will bring in

## 2014-12-21 ENCOUNTER — Other Ambulatory Visit: Payer: Self-pay | Admitting: Family Medicine

## 2014-12-23 NOTE — Telephone Encounter (Signed)
This is okay to refill 

## 2014-12-23 NOTE — Telephone Encounter (Signed)
Last written on 11/01/14 for 7 tablets.  If approved route to nurse to call into [facility] in [place].

## 2015-01-19 ENCOUNTER — Telehealth: Payer: Self-pay | Admitting: Family Medicine

## 2015-01-23 NOTE — Telephone Encounter (Signed)
We haven't received a fax on Alison White. Tried to call the # on the request and it's to a fax machine.

## 2015-01-24 NOTE — Telephone Encounter (Signed)
Faxed a cover sheet to [name] to let him know that we hadn't received anything on Alison White. Also told him we couldn't call him back because we didn't have a phone# to notify him.

## 2015-02-06 ENCOUNTER — Telehealth: Payer: Self-pay | Admitting: Family Medicine

## 2015-02-06 NOTE — Telephone Encounter (Signed)
Note printed and pt aware to come by to pick up.

## 2015-02-06 NOTE — Telephone Encounter (Signed)
Pt needs a letter stating she can't return to work until 11/01/2014 instead of 10/28/14 for her insurance. Is this ok?

## 2015-02-06 NOTE — Telephone Encounter (Signed)
Please give patient a corrected note for when she is returning but the note is reading August and this is November--- give her a note for the appropriate time that she actually was out and when she returned

## 2015-04-28 LAB — HM MAMMOGRAPHY: HM Mammogram: NEGATIVE

## 2015-05-01 ENCOUNTER — Ambulatory Visit (INDEPENDENT_AMBULATORY_CARE_PROVIDER_SITE_OTHER): Payer: 59 | Admitting: Nurse Practitioner

## 2015-05-01 ENCOUNTER — Encounter: Payer: Self-pay | Admitting: Nurse Practitioner

## 2015-05-01 VITALS — BP 132/84 | HR 93 | Temp 97.3°F | Ht 60.0 in | Wt 170.0 lb

## 2015-05-01 DIAGNOSIS — H6523 Chronic serous otitis media, bilateral: Secondary | ICD-10-CM | POA: Diagnosis not present

## 2015-05-01 MED ORDER — FLUTICASONE PROPIONATE 50 MCG/ACT NA SUSP: 2.0000 | Freq: Every day | NASAL | Status: DC

## 2015-05-01 NOTE — Progress Notes (Signed)
  Subjective:     Alison White is a 60 y.o. female who presents with ear pain and possible ear infection. Symptoms include: left ear pain and congestion. Onset of symptoms was 4 days ago, and have been gradually worsening since that time. Associated symptoms include: sinus pressure.  Patient denies: achiness, chills, fever , headache, non productive cough and productive cough. She is drinking plenty of fluids.  The following portions of the patient's history were reviewed and updated as appropriate: allergies, current medications, past family history, past medical history, past social history, past surgical history and problem list.  Review of Systems Pertinent items are noted in HPI.   Objective:    BP 132/84 mmHg  Pulse 93  Temp(Src) 97.3 F (36.3 C) (Oral)  Ht 5' (1.524 m)  Wt 170 lb (77.111 kg)  BMI 33.20 kg/m2 General:  alert and cooperative  Right Ear: normal- clear effusion  Left Ear: diminished mobility due to clear effusion  Mouth:  lips, mucosa, and tongue normal; teeth and gums normal  Neck: no adenopathy, no carotid bruit, no JVD, supple, symmetrical, trachea midline and thyroid not enlarged, symmetric, no tenderness/mass/nodules     Assessment:    Bilateral chronic otitis media   Plan:   Meds ordered this encounter  Medications  . fluticasone (FLONASE) 50 MCG/ACT nasal spray    Sig: Place 2 sprays into both nostrils daily.    Dispense:  16 g    Refill:  6    Order Specific Question:  Supervising Provider    Answer:  Joycelyn Man   Force fluids Rest  Motrin or tylenol OTC for pain RTO prn  Mary-Margaret Hassell Done, FNP

## 2015-05-01 NOTE — Patient Instructions (Signed)
Serous Otitis Media Serous otitis media is fluid in the middle ear space. This space contains the bones for hearing and air. Air in the middle ear space helps to transmit sound.  The air gets there through the eustachian tube. This tube goes from the back of the nose (nasopharynx) to the middle ear space. It keeps the pressure in the middle ear the same as the outside world. It also helps to drain fluid from the middle ear space. CAUSES  Serous otitis media occurs when the eustachian tube gets blocked. Blockage can come from:  Ear infections.  Colds and other upper respiratory infections.  Allergies.  Irritants such as cigarette smoke.  Sudden changes in air pressure (such as descending in an airplane).  Enlarged adenoids.  A mass in the nasopharynx. During colds and upper respiratory infections, the middle ear space can become temporarily filled with fluid. This can happen after an ear infection also. Once the infection clears, the fluid will generally drain out of the ear through the eustachian tube. If it does not, then serous otitis media occurs. SIGNS AND SYMPTOMS   Hearing loss.  A feeling of fullness in the ear, without pain.  Young children may not show any symptoms but may show slight behavioral changes, such as agitation, ear pulling, or crying. DIAGNOSIS  Serous otitis media is diagnosed by an ear exam. Tests may be done to check on the movement of the eardrum. Hearing exams may also be done. TREATMENT  The fluid most often goes away without treatment. If allergy is the cause, allergy treatment may be helpful. Fluid that persists for several months may require minor surgery. A small tube is placed in the eardrum to:  Drain the fluid.  Restore the air in the middle ear space. In certain situations, antibiotic medicines are used to avoid surgery. Surgery may be done to remove enlarged adenoids (if this is the cause). HOME CARE INSTRUCTIONS   Keep children away from  tobacco smoke.  Keep all follow-up visits as directed by your health care provider. SEEK MEDICAL CARE IF:   Your hearing is not better in 3 months.  Your hearing is worse.  You have ear pain.  You have drainage from the ear.  You have dizziness.  You have serous otitis media only in one ear or have any bleeding from your nose (epistaxis).  You notice a lump on your neck. MAKE SURE YOU:  Understand these instructions.   Will watch your condition.   Will get help right away if you are not doing well or get worse.    This information is not intended to replace advice given to you by your health care provider. Make sure you discuss any questions you have with your health care provider.   Document Released: 05/25/2003 Document Revised: 03/25/2014 Document Reviewed: 09/29/2012 Elsevier Interactive Patient Education 2016 Else[employer] Inc.  

## 2015-05-05 ENCOUNTER — Encounter: Payer: Self-pay | Admitting: *Deleted

## 2015-06-08 ENCOUNTER — Telehealth: Payer: Self-pay | Admitting: Family Medicine

## 2016-05-24 DIAGNOSIS — Z1231 Encounter for screening mammogram for malignant neoplasm of breast: Secondary | ICD-10-CM | POA: Diagnosis not present

## 2016-07-24 ENCOUNTER — Ambulatory Visit (INDEPENDENT_AMBULATORY_CARE_PROVIDER_SITE_OTHER): Payer: 59 | Admitting: Family Medicine

## 2016-07-24 ENCOUNTER — Encounter: Payer: Self-pay | Admitting: Family Medicine

## 2016-07-24 VITALS — BP 130/84 | HR 78 | Temp 98.4°F | Ht 60.0 in | Wt 176.0 lb

## 2016-07-24 DIAGNOSIS — R300 Dysuria: Secondary | ICD-10-CM | POA: Diagnosis not present

## 2016-07-24 LAB — URINALYSIS, COMPLETE
Bilirubin, UA: NEGATIVE
Glucose, UA: NEGATIVE
Ketones, UA: NEGATIVE
LEUKOCYTES UA: NEGATIVE
NITRITE UA: NEGATIVE
Protein, UA: NEGATIVE
Specific Gravity, UA: 1.025 (ref 1.005–1.030)
Urobilinogen, Ur: 0.2 mg/dL (ref 0.2–1.0)
pH, UA: 5.5 (ref 5.0–7.5)

## 2016-07-24 LAB — MICROSCOPIC EXAMINATION: Renal Epithel, UA: NONE SEEN /hpf

## 2016-07-24 MED ORDER — CEPHALEXIN 500 MG PO CAPS: 500.0000 mg | ORAL_CAPSULE | Freq: Four times a day (QID) | ORAL | 0 refills | Status: DC

## 2016-07-24 NOTE — Progress Notes (Signed)
BP 130/84   Pulse 78   Temp 98.4 F (36.9 C) (Oral)   Ht 5' (1.524 m)   Wt 176 lb (79.8 kg)   BMI 34.37 kg/m    Subjective:    Patient ID: Alison White, female    DOB: [birth year], 61 y.o.   MRN: [id number]  HPI: Alison White is a 61 y.o. female presenting on 07/24/2016 for Urinary Tract Infection (pressure in bladder, urinary frequency, began 4 days ago)   HPI Dysuria and bladder pressure Patient has dysuria and bladder pressure that began 4 days ago and is increased to where she is having urinary frequency starting just yesterday. She denies any fevers or chills or flank pain. Her abdominal pressure comes and goes but makes it feel like she has to urinate frequently. She denies any nausea or vomiting or abdominal pain anywhere else besides that pressure in the lower abdomen. She has been trying to hydrate better but does not seem to make a difference. She does not get urinary tract infections frequently  Relevant past medical, surgical, family and social history reviewed and updated as indicated. Interim medical history since our last visit reviewed. Allergies and medications reviewed and updated.  Review of Systems  Constitutional: Negative for chills and fever.  HENT: Negative for congestion, ear discharge and ear pain.   Eyes: Negative for redness and visual disturbance.  Respiratory: Negative for chest tightness and shortness of breath.   Cardiovascular: Negative for chest pain and leg swelling.  Gastrointestinal: Positive for abdominal pain. Negative for diarrhea and vomiting.  Genitourinary: Positive for dysuria. Negative for difficulty urinating, flank pain and frequency.  Musculoskeletal: Negative for back pain and gait problem.  Skin: Negative for rash.  Neurological: Negative for light-headedness and headaches.  Psychiatric/Behavioral: Negative for agitation and behavioral problems.  All other systems reviewed and are negative.  Per HPI unless specifically  indicated above     Objective:    BP 130/84   Pulse 78   Temp 98.4 F (36.9 C) (Oral)   Ht 5' (1.524 m)   Wt 176 lb (79.8 kg)   BMI 34.37 kg/m   Wt Readings from Last 3 Encounters:  07/24/16 176 lb (79.8 kg)  05/01/15 170 lb (77.1 kg)  10/27/14 176 lb (79.8 kg)    Physical Exam  Constitutional: She is oriented to person, place, and time. She appears well-developed and well-nourished. No distress.  Eyes: Conjunctivae are normal.  Cardiovascular: Normal rate, regular rhythm, normal heart sounds and intact distal pulses.   No murmur heard. Pulmonary/Chest: Effort normal and breath sounds normal. No respiratory distress. She has no wheezes. She has no rales.  Abdominal: Soft. Bowel sounds are normal. She exhibits no distension. There is no tenderness. There is no rigidity, no rebound, no guarding and no CVA tenderness.  Musculoskeletal: Normal range of motion. She exhibits no edema or tenderness.  Neurological: She is alert and oriented to person, place, and time. Coordination normal.  Skin: Skin is warm and dry. No rash noted. She is not diaphoretic.  Psychiatric: She has a normal mood and affect. Her behavior is normal.  Nursing note and vitals reviewed.     Assessment & Plan:   Problem List Items Addressed This Visit    None    Visit Diagnoses    Dysuria    -  Primary   We will treat based on symptoms, will send Keflex for her.   Relevant Medications   cephALEXin (KEFLEX) 500 MG capsule  Other Relevant Orders   Urinalysis, Complete       Follow up plan: Return if symptoms worsen or fail to improve.  Counseling provided for all of the vaccine components Orders Placed This Encounter  Procedures  . Urinalysis, Complete    Caryl Pina, MD [facility] 07/24/2016, 4:21 PM

## 2016-08-08 ENCOUNTER — Other Ambulatory Visit: Payer: Self-pay | Admitting: Family Medicine

## 2016-08-08 DIAGNOSIS — R300 Dysuria: Secondary | ICD-10-CM

## 2016-09-22 DIAGNOSIS — L03312 Cellulitis of back [any part except buttock]: Secondary | ICD-10-CM | POA: Diagnosis not present

## 2016-09-22 DIAGNOSIS — W57XXXA Bitten or stung by nonvenomous insect and other nonvenomous arthropods, initial encounter: Secondary | ICD-10-CM | POA: Diagnosis not present

## 2017-07-07 ENCOUNTER — Encounter: Payer: Self-pay | Admitting: Family Medicine

## 2017-07-07 DIAGNOSIS — Z1231 Encounter for screening mammogram for malignant neoplasm of breast: Secondary | ICD-10-CM | POA: Diagnosis not present

## 2017-10-30 ENCOUNTER — Encounter: Payer: Self-pay | Admitting: Family

## 2017-10-30 ENCOUNTER — Ambulatory Visit: Payer: 59 | Admitting: Family

## 2017-10-30 VITALS — BP 160/92 | HR 81 | Temp 98.8°F | Ht 60.0 in | Wt 177.0 lb

## 2017-10-30 DIAGNOSIS — R300 Dysuria: Secondary | ICD-10-CM

## 2017-10-30 DIAGNOSIS — J0100 Acute maxillary sinusitis, unspecified: Secondary | ICD-10-CM

## 2017-10-30 DIAGNOSIS — R030 Elevated blood-pressure reading, without diagnosis of hypertension: Secondary | ICD-10-CM

## 2017-10-30 LAB — URINALYSIS
Bilirubin, UA: NEGATIVE
GLUCOSE, UA: NEGATIVE
Ketones, UA: NEGATIVE
Nitrite, UA: NEGATIVE
PROTEIN UA: NEGATIVE
Specific Gravity, UA: >1.03 — ABNORMAL HIGH (ref 1.005–1.030)
Urobilinogen, Ur: 0.2 mg/dL (ref 0.2–1.0)
pH, UA: 5.5 (ref 5.0–7.5)

## 2017-10-30 MED ORDER — FLUTICASONE PROPIONATE 50 MCG/ACT NA SUSP: 2.0000 | Freq: Every day | NASAL | 6 refills | Status: DC

## 2017-10-30 MED ORDER — AMOXICILLIN-POT CLAVULANATE 875-125 MG PO TABS: 1.0000 | ORAL_TABLET | Freq: Two times a day (BID) | ORAL | 0 refills | Status: DC

## 2017-10-30 NOTE — Patient Instructions (Signed)

## 2017-10-30 NOTE — Progress Notes (Signed)
Subjective:    Patient ID: Alison White, female    DOB: 02/21/1956, 62 y.o.   MRN: [id number]  Chief Complaint  Patient presents with  . Urinary Tract Infection    ?  . Back Pain  . Sinus Problem  . Ear Pain  . Nausea    Sinusitis  This is a new problem. The current episode started in the past 7 days. The problem has been waxing and waning since onset. There has been no fever. Her pain is at a severity of 8/10. The pain is moderate. Associated symptoms include congestion, coughing, ear pain, headaches, a hoarse voice and sinus pressure. Pertinent negatives include no sore throat. Past treatments include acetaminophen and oral decongestants. The treatment provided mild relief.  Urinary Tract Infection   This is a new problem. The current episode started in the past 7 days. The problem occurs intermittently. The problem has been waxing and waning. The patient is experiencing no pain. Associated symptoms include flank pain and nausea. Pertinent negatives include no hematuria, urgency or vomiting. She has tried increased fluids for the symptoms. The treatment provided mild relief.      Review of Systems  HENT: Positive for congestion, ear pain, hoarse voice and sinus pressure. Negative for sore throat.   Respiratory: Positive for cough.   Gastrointestinal: Positive for nausea. Negative for vomiting.  Genitourinary: Positive for flank pain. Negative for hematuria and urgency.  Neurological: Positive for headaches.  All other systems reviewed and are negative.      Objective:   Physical Exam  Constitutional: She is oriented to person, place, and time. She appears well-developed and well-nourished. No distress.  HENT:  Head: Normocephalic and atraumatic.  Right Ear: External ear normal.  Left Ear: A middle ear effusion is present.  Nose: Mucosal edema and rhinorrhea present. Right sinus exhibits maxillary sinus tenderness. Left sinus exhibits maxillary sinus tenderness.    Mouth/Throat: Posterior oropharyngeal erythema present.  Eyes: Pupils are equal, round, and reactive to light.  Neck: Normal range of motion. Neck supple. No thyromegaly present.  Cardiovascular: Normal rate, regular rhythm, normal heart sounds and intact distal pulses.  No murmur heard. Pulmonary/Chest: Effort normal and breath sounds normal. No respiratory distress. She has no wheezes.  Abdominal: Soft. Bowel sounds are normal. She exhibits no distension. There is no tenderness.  Musculoskeletal: Normal range of motion. She exhibits no edema or tenderness.  Neurological: She is alert and oriented to person, place, and time. She has normal reflexes. No cranial nerve deficit.  Skin: Skin is warm and dry.  Psychiatric: She has a normal mood and affect. Her behavior is normal. Judgment and thought content normal.  Vitals reviewed.     BP (!) 160/92   Pulse 81   Temp 98.8 F (37.1 C) (Oral)   Ht 5' (1.524 m)   Wt 177 lb (80.3 kg)   BMI 34.57 kg/m      Assessment & Plan:  [name] comes in today with chief complaint of Urinary Tract Infection (?); Back Pain; Sinus Problem; Ear Pain; and Nausea   Diagnosis and orders addressed:  1. Dysuria - Urine Culture - Urinalysis  2. Acute maxillary sinusitis, recurrence not specified Force fluids RTO prn Culture pending - amoxicillin-clavulanate (AUGMENTIN) 875-125 MG tablet; Take 1 tablet by mouth 2 (two) times daily.  Dispense: 14 tablet; Refill: 0 - fluticasone (FLONASE) 50 MCG/ACT nasal spray; Place 2 sprays into both nostrils daily.  Dispense: 16 g; Refill: 6  3.  Elevated blood pressure reading RTO in 2 weeks to recheck    Alison Dun, FNP

## 2017-10-31 LAB — URINE CULTURE

## 2017-11-13 ENCOUNTER — Encounter: Payer: Self-pay | Admitting: Family

## 2017-11-13 ENCOUNTER — Ambulatory Visit (INDEPENDENT_AMBULATORY_CARE_PROVIDER_SITE_OTHER): Payer: 59 | Admitting: Family

## 2017-11-13 VITALS — BP 130/81 | HR 83 | Temp 98.5°F | Ht 60.0 in | Wt 175.6 lb

## 2017-11-13 DIAGNOSIS — E669 Obesity, unspecified: Secondary | ICD-10-CM | POA: Insufficient documentation

## 2017-11-13 DIAGNOSIS — Z0000 Encounter for general adult medical examination without abnormal findings: Secondary | ICD-10-CM | POA: Diagnosis not present

## 2017-11-13 DIAGNOSIS — J301 Allergic rhinitis due to pollen: Secondary | ICD-10-CM

## 2017-11-13 DIAGNOSIS — Z1159 Encounter for screening for other viral diseases: Secondary | ICD-10-CM | POA: Diagnosis not present

## 2017-11-13 DIAGNOSIS — Z23 Encounter for immunization: Secondary | ICD-10-CM

## 2017-11-13 DIAGNOSIS — Z114 Encounter for screening for human immunodeficiency virus [HIV]: Secondary | ICD-10-CM

## 2017-11-13 MED ORDER — LEVOCETIRIZINE DIHYDROCHLORIDE 5 MG PO TABS: 5.0000 mg | ORAL_TABLET | Freq: Every evening | ORAL | 1 refills | Status: DC

## 2017-11-13 MED ORDER — FLUTICASONE PROPIONATE 50 MCG/ACT NA SUSP: 2.0000 | Freq: Every day | NASAL | 6 refills | Status: DC

## 2017-11-13 NOTE — Progress Notes (Addendum)
   Subjective:    Patient ID: Alison White, female    DOB: [birth year], 62 y.o.   MRN: [id number]  Chief Complaint  Patient presents with  . recheck from sinus infection   PT presents to the office today for CPE and recheck sinus issues.  Sinusitis  This is a recurrent problem. The current episode started 1 to 4 weeks ago. The problem has been gradually improving since onset. There has been no fever. Her pain is at a severity of 1/10. The pain is mild. Associated symptoms include congestion, ear pain (left) and sinus pressure (Coming and going). Pertinent negatives include no coughing, headaches, hoarse voice or sore throat. Past treatments include oral decongestants. The treatment provided mild relief.      Review of Systems  HENT: Positive for congestion, ear pain (left) and sinus pressure (Coming and going). Negative for hoarse voice and sore throat.   Respiratory: Negative for cough.   Neurological: Negative for headaches.  All other systems reviewed and are negative.      Objective:   Physical Exam  Constitutional: She is oriented to person, place, and time. She appears well-developed and well-nourished. No distress.  HENT:  Head: Normocephalic and atraumatic.  Right Ear: External ear normal.  Left Ear: External ear normal.  Nose: Mucosal edema and rhinorrhea present. Left sinus exhibits maxillary sinus tenderness.  Mouth/Throat: Oropharynx is clear and moist.  Eyes: Pupils are equal, round, and reactive to light.  Neck: Normal range of motion. Neck supple. No thyromegaly present.  Cardiovascular: Normal rate, regular rhythm, normal heart sounds and intact distal pulses.  No murmur heard. Pulmonary/Chest: Effort normal and breath sounds normal. No respiratory distress. She has no wheezes.  Abdominal: Soft. Bowel sounds are normal. She exhibits no distension. There is no tenderness.  Musculoskeletal: Normal range of motion. She exhibits no edema or tenderness.    Neurological: She is alert and oriented to person, place, and time. She has normal reflexes. No cranial nerve deficit.  Skin: Skin is warm and dry.  Psychiatric: She has a normal mood and affect. Her behavior is normal. Judgment and thought content normal.  Vitals reviewed.     BP 130/81   Pulse 83   Temp 98.5 F (36.9 C) (Oral)   Ht 5' (1.524 m)   Wt 175 lb 9.6 oz (79.7 kg)   BMI 34.29 kg/m      Assessment & Plan:  Alison White comes in today with chief complaint of recheck from sinus infection and Annual Exam   Diagnosis and orders addressed:  1. Annual physical exam - CMP14+EGFR - CBC with Differential/Platelet - Lipid panel - TSH - Hepatitis C antibody - HIV antibody  2. Allergic rhinitis due to pollen, unspecified seasonality Start Xyzal and continue flonase - levocetirizine (XYZAL) 5 MG tablet; Take 1 tablet (5 mg total) by mouth every evening.  Dispense: 90 tablet; Refill: 1 - fluticasone (FLONASE) 50 MCG/ACT nasal spray; Place 2 sprays into both nostrils daily.  Dispense: 16 g; Refill: 6 - CMP14+EGFR - CBC with Differential/Platelet  3. Need for hepatitis C screening test - CMP14+EGFR - CBC with Differential/Platelet - Hepatitis C antibody  4. Screening for HIV (human immunodeficiency virus) - CMP14+EGFR - CBC with Differential/Platelet - HIV antibody   5. Obesity (BMI 30-39.9)   Labs pending Health Maintenance reviewed- TDAP given today Diet and exercise encouraged  Follow up plan: 1 year   Evelina Dun, FNP

## 2017-11-13 NOTE — Addendum Note (Signed)
Addended by: Shelbie Ammons on: 11/13/2017 02:38 PM   Modules accepted: Orders

## 2017-11-13 NOTE — Patient Instructions (Signed)

## 2017-11-14 LAB — LIPID PANEL
Chol/HDL Ratio: 5.2 ratio — ABNORMAL HIGH (ref 0.0–4.4)
Cholesterol, Total: 256 mg/dL — ABNORMAL HIGH (ref 100–199)
HDL: 49 mg/dL (ref >39)
Triglycerides: 420 mg/dL — ABNORMAL HIGH (ref 0–149)

## 2017-11-14 LAB — CBC WITH DIFFERENTIAL/PLATELET
Basophils Absolute: 0 10*3/uL (ref 0.0–0.2)
Basos: 0 %
EOS (ABSOLUTE): 0.1 10*3/uL (ref 0.0–0.4)
EOS: 2 %
HEMOGLOBIN: 13.4 g/dL (ref 11.1–15.9)
Hematocrit: 39.8 % (ref 34.0–46.6)
IMMATURE GRANS (ABS): 0 10*3/uL (ref 0.0–0.1)
Immature Granulocytes: 0 %
Lymphocytes Absolute: 1.5 10*3/uL (ref 0.7–3.1)
Lymphs: 23 %
MCH: 34.4 pg — ABNORMAL HIGH (ref 26.6–33.0)
MCHC: 33.7 g/dL (ref 31.5–35.7)
MCV: 102 fL — AB (ref 79–97)
MONOCYTES: 7 %
Monocytes Absolute: 0.5 10*3/uL (ref 0.1–0.9)
Neutrophils Absolute: 4.4 10*3/uL (ref 1.4–7.0)
Neutrophils: 68 %
PLATELETS: 279 10*3/uL (ref 150–450)
RBC: 3.9 x10E6/uL (ref 3.77–5.28)
RDW: 13.4 % (ref 12.3–15.4)
WBC: 6.5 10*3/uL (ref 3.4–10.8)

## 2017-11-14 LAB — CMP14+EGFR
ALBUMIN: 4.8 g/dL (ref 3.6–4.8)
ALK PHOS: 91 IU/L (ref 39–117)
ALT: 36 IU/L — ABNORMAL HIGH (ref 0–32)
AST: 33 IU/L (ref 0–40)
Albumin/Globulin Ratio: 2.1 (ref 1.2–2.2)
BUN / CREAT RATIO: 26 (ref 12–28)
BUN: 19 mg/dL (ref 8–27)
Bilirubin Total: 0.3 mg/dL (ref 0.0–1.2)
CO2: 23 mmol/L (ref 20–29)
CREATININE: 0.73 mg/dL (ref 0.57–1.00)
Calcium: 9.5 mg/dL (ref 8.7–10.3)
Chloride: 106 mmol/L (ref 96–106)
GFR calc Af Amer: 102 mL/min/{1.73_m2} (ref >59)
GFR calc non Af Amer: 89 mL/min/{1.73_m2} (ref >59)
GLUCOSE: 107 mg/dL — AB (ref 65–99)
Globulin, Total: 2.3 g/dL (ref 1.5–4.5)
Potassium: 3.9 mmol/L (ref 3.5–5.2)
Sodium: 146 mmol/L — ABNORMAL HIGH (ref 134–144)
Total Protein: 7.1 g/dL (ref 6.0–8.5)

## 2017-11-14 LAB — HEPATITIS C ANTIBODY: Hep C Virus Ab: <0.1 s/co ratio (ref 0.0–0.9)

## 2017-11-14 LAB — TSH: TSH: 1.14 u[IU]/mL (ref 0.450–4.500)

## 2017-11-14 LAB — HIV ANTIBODY (ROUTINE TESTING W REFLEX): HIV Screen 4th Generation wRfx: NONREACTIVE

## 2017-11-18 ENCOUNTER — Other Ambulatory Visit: Payer: Self-pay | Admitting: Family

## 2017-11-18 DIAGNOSIS — E781 Pure hyperglyceridemia: Secondary | ICD-10-CM | POA: Insufficient documentation

## 2017-11-18 MED ORDER — ATORVASTATIN CALCIUM 40 MG PO TABS: 40.0000 mg | ORAL_TABLET | Freq: Every day | ORAL | 11 refills | Status: DC

## 2018-01-02 ENCOUNTER — Ambulatory Visit (INDEPENDENT_AMBULATORY_CARE_PROVIDER_SITE_OTHER): Payer: 59

## 2018-01-02 DIAGNOSIS — Z23 Encounter for immunization: Secondary | ICD-10-CM

## 2018-07-10 ENCOUNTER — Encounter: Payer: Self-pay | Admitting: Physician Assistant

## 2018-07-10 ENCOUNTER — Other Ambulatory Visit: Payer: Self-pay

## 2018-07-10 ENCOUNTER — Telehealth: Payer: Self-pay | Admitting: Family

## 2018-07-10 ENCOUNTER — Ambulatory Visit (INDEPENDENT_AMBULATORY_CARE_PROVIDER_SITE_OTHER): Payer: 59 | Admitting: Physician Assistant

## 2018-07-10 DIAGNOSIS — F419 Anxiety disorder, unspecified: Secondary | ICD-10-CM | POA: Diagnosis not present

## 2018-07-10 MED ORDER — LORAZEPAM 0.5 MG PO TABS: 0.2500 mg | ORAL_TABLET | Freq: Two times a day (BID) | ORAL | 0 refills | Status: DC | PRN

## 2018-07-10 NOTE — Progress Notes (Signed)
     Telephone visit  Subjective: MG:[id number] PCP: Sharion Balloon, FNP KDT:OIZTI Alison White is a 63 y.o. female calls for telephone consult today. Patient provides verbal consent for consult held via phone.  Patient is identified with 2 separate identifiers.  At this time the entire area is on COVID-19 social distancing and stay home orders are in place.  Patient is of higher risk and therefore we are performing this by a virtual method.  Location of patient: home Location of provider: [facility] Others present for call: n  This visit is to talk about the patient's long-term anxiety that is going on.  Normally it is quite mild and she can control it.  However she has been out of work since March.  And for the first couple of weeks it was okay but she now is quite jittery, has a nervous stomach, has interrupted sleep.  She just feels nervous all the time.  Her gad score was quite high.  In the past she had taken Ativan she was given 7 tablets and a prescription back in 2016.  And she did okay with this.  We are going to send this prescription but have a close recall with her PCP.  And at that time if the COVID-19 situation is still going on to start an SSRI for long-term treatment.   GAD 7 : Generalized Anxiety Score 07/10/2018  Nervous, Anxious, on Edge 3  Control/stop worrying 3  Worry too much - different things 3  Trouble relaxing 3  Restless 3  Easily annoyed or irritable 3  Afraid - awful might happen 3  Total GAD 7 Score 21  Anxiety Difficulty Not difficult at all     ROS: Per HPI  No Known Allergies No past medical history on file.  Current Outpatient Medications:  .  atorvastatin (LIPITOR) 40 MG tablet, Take 1 tablet (40 mg total) by mouth daily., Disp: 30 tablet, Rfl: 11 .  BLACK COHOSH PO, Take by mouth., Disp: , Rfl:  .  fluticasone (FLONASE) 50 MCG/ACT nasal spray, Place 2 sprays into both nostrils daily., Disp: 16 g, Rfl: 6 .  levocetirizine (XYZAL) 5 MG tablet, Take 1  tablet (5 mg total) by mouth every evening., Disp: 90 tablet, Rfl: 1 .  LORazepam (ATIVAN) 0.5 MG tablet, Take 0.5-1 tablets (0.25-0.5 mg total) by mouth 2 (two) times daily as needed for anxiety., Disp: 15 tablet, Rfl: 0  Assessment/ Plan: 63 y.o. female   1. Anxiety - LORazepam (ATIVAN) 0.5 MG tablet; Take 0.5-1 tablets (0.25-0.5 mg total) by mouth 2 (two) times daily as needed for anxiety.  Dispense: 15 tablet; Refill: 0   Start time: 2:42 PM End time: 2:51 PM  Meds ordered this encounter  Medications  . LORazepam (ATIVAN) 0.5 MG tablet    Sig: Take 0.5-1 tablets (0.25-0.5 mg total) by mouth 2 (two) times daily as needed for anxiety.    Dispense:  15 tablet    Refill:  0    Order Specific Question:   Supervising Provider    Answer:   Janora Norlander [[id number]]    Particia Nearing PA-C [facility] [phone]

## 2018-07-10 NOTE — Telephone Encounter (Signed)
Making appt

## 2018-07-10 NOTE — Progress Notes (Signed)
Apt scheduled.  

## 2018-07-24 ENCOUNTER — Other Ambulatory Visit: Payer: Self-pay

## 2018-07-24 ENCOUNTER — Encounter: Payer: Self-pay | Admitting: Family

## 2018-07-24 ENCOUNTER — Ambulatory Visit (INDEPENDENT_AMBULATORY_CARE_PROVIDER_SITE_OTHER): Payer: 59 | Admitting: Family

## 2018-07-24 DIAGNOSIS — F411 Generalized anxiety disorder: Secondary | ICD-10-CM | POA: Diagnosis not present

## 2018-07-24 MED ORDER — ESCITALOPRAM OXALATE 10 MG PO TABS: 10.0000 mg | ORAL_TABLET | Freq: Every day | ORAL | 3 refills | Status: DC

## 2018-07-24 NOTE — Progress Notes (Signed)
   Virtual Visit via telephone Note  I connected with Alison White on 07/24/18 at 8:42 AM by telephone and verified that I am speaking with the correct person using two identifiers. Alison White is currently located at home and no one is currently with her during visit. The provider, Evelina Dun, FNP is located in their office at time of visit.  I discussed the limitations, risks, security and privacy concerns of performing an evaluation and management service by telephone and the availability of in person appointments. I also discussed with the patient that there may be a patient responsible charge related to this service. The patient expressed understanding and agreed to proceed.   History and Present Illness:  Pt calls today recheck GAD. She had a telephone visit two weeks and was restarted on her ativan 0.25 mg daily. Pt states she continues to have anxiety and feels the ativan "wears off after 8 hours".  Anxiety  Presents for follow-up visit. Symptoms include decreased concentration, depressed mood, excessive worry, irritability, nervous/anxious behavior, palpitations, panic and restlessness. Symptoms occur most days. The severity of symptoms is moderate. The quality of sleep is good.        Review of Systems  Constitutional: Positive for irritability.  Cardiovascular: Positive for palpitations.  Psychiatric/Behavioral: Positive for decreased concentration. The patient is nervous/anxious.   All other systems reviewed and are negative.    Observations/Objective: No SOB or distress noted  Assessment and Plan: Alison White comes in today with chief complaint of No chief complaint on file.   Diagnosis and orders addressed:  1. GAD (generalized anxiety disorder) Will start lexapro today Discussed that Ativan is controlled and can become additive. We will start Lexapro and only take Ativan as needed and in hopes to wean completely off. PT reviewed in [place] controlled  database- No red flags Stress management discussed RTO in 4 week to recheck - escitalopram (LEXAPRO) 10 MG tablet; Take 1 tablet (10 mg total) by mouth daily.  Dispense: 90 tablet; Refill: 3       I discussed the assessment and treatment plan with the patient. The patient was provided an opportunity to ask questions and all were answered. The patient agreed with the plan and demonstrated an understanding of the instructions.   The patient was advised to call back or seek an in-person evaluation if the symptoms worsen or if the condition fails to improve as anticipated.  The above assessment and management plan was discussed with the patient. The patient verbalized understanding of and has agreed to the management plan. Patient is aware to call the clinic if symptoms persist or worsen. Patient is aware when to return to the clinic for a follow-up visit. Patient educated on when it is appropriate to go to the emergency department.   Time call ended:  8:57 AM  I provided 15 minutes of non-face-to-face time during this encounter.    Evelina Dun, FNP

## 2018-07-25 DIAGNOSIS — R42 Dizziness and giddiness: Secondary | ICD-10-CM | POA: Diagnosis not present

## 2018-07-25 DIAGNOSIS — T50995A Adverse effect of other drugs, medicaments and biological substances, initial encounter: Secondary | ICD-10-CM | POA: Diagnosis not present

## 2018-07-27 ENCOUNTER — Other Ambulatory Visit: Payer: Self-pay

## 2018-07-27 ENCOUNTER — Encounter: Payer: Self-pay | Admitting: Family

## 2018-07-27 ENCOUNTER — Ambulatory Visit (INDEPENDENT_AMBULATORY_CARE_PROVIDER_SITE_OTHER): Payer: 59 | Admitting: Family

## 2018-07-27 DIAGNOSIS — F411 Generalized anxiety disorder: Secondary | ICD-10-CM

## 2018-07-27 MED ORDER — LORAZEPAM 0.5 MG PO TABS: 0.2500 mg | ORAL_TABLET | Freq: Every day | ORAL | 1 refills | Status: DC

## 2018-07-27 NOTE — Progress Notes (Signed)
   Virtual Visit via telephone Note  I connected with Alison White on 07/27/18 at 12:26 pm  by telephone and verified that I am speaking with the correct person using two identifiers. Alison White is currently located at home and no one is currently with her during visit. The provider, Evelina Dun, FNP is located in their office at time of visit.  I discussed the limitations, risks, security and privacy concerns of performing an evaluation and management service by telephone and the availability of in person appointments. I also discussed with the patient that there may be a patient responsible charge related to this service. The patient expressed understanding and agreed to proceed.   History and Present Illness:  PT calls today after starting Lexapro 07/25/18. She states she started shaking, panic attack, dizzy, nausea,  weakness, and chest tightness. She went to the ED.   She was started on Ativan 0.25 mg 07/10/18 after having increased anxiety after she was laid off of work. She has been taking the ativan 0.25 mg daily that has helped. She is hoping to go back to work in the next week or so. She feels like once she starts work her anxiety will improve.  Anxiety  Presents for follow-up visit. Symptoms include depressed mood, excessive worry, irritability, nervous/anxious behavior and restlessness. Symptoms occur most days. The severity of symptoms is moderate. The quality of sleep is good.        Review of Systems  Constitutional: Positive for irritability.  Psychiatric/Behavioral: The patient is nervous/anxious.   All other systems reviewed and are negative.    Observations/Objective: No SOB or distress  Assessment and Plan: 1. GAD (generalized anxiety disorder) Lexapro added to allergy list Pt states she got a letter in the mail stating she would return back to work in the next week or so. She feels like her anxiety will improve once back to work. We will hold off on any  other medication changes.  Pt reviewed in [place] controlled database- No red flags noted RTO in 1 month - LORazepam (ATIVAN) 0.5 MG tablet; Take 0.5-1 tablets (0.25-0.5 mg total) by mouth at bedtime.  Dispense: 30 tablet; Refill: 1     I discussed the assessment and treatment plan with the patient. The patient was provided an opportunity to ask questions and all were answered. The patient agreed with the plan and demonstrated an understanding of the instructions.   The patient was advised to call back or seek an in-person evaluation if the symptoms worsen or if the condition fails to improve as anticipated.  The above assessment and management plan was discussed with the patient. The patient verbalized understanding of and has agreed to the management plan. Patient is aware to call the clinic if symptoms persist or worsen. Patient is aware when to return to the clinic for a follow-up visit. Patient educated on when it is appropriate to go to the emergency department.   Time call ended:  12:41 pm  I provided 15 minutes of non-face-to-face time during this encounter.    Evelina Dun, FNP

## 2018-08-25 ENCOUNTER — Other Ambulatory Visit: Payer: Self-pay

## 2018-08-25 ENCOUNTER — Encounter: Payer: Self-pay | Admitting: Family

## 2018-08-25 ENCOUNTER — Ambulatory Visit: Payer: 59 | Admitting: Family

## 2018-08-25 VITALS — BP 141/94 | HR 80 | Temp 97.6°F | Ht 60.0 in | Wt 165.6 lb

## 2018-08-25 DIAGNOSIS — F411 Generalized anxiety disorder: Secondary | ICD-10-CM | POA: Diagnosis not present

## 2018-08-25 MED ORDER — LORAZEPAM 0.5 MG PO TABS: 0.2500 mg | ORAL_TABLET | Freq: Every day | ORAL | 1 refills | Status: DC

## 2018-08-25 MED ORDER — BUSPIRONE HCL 7.5 MG PO TABS: 7.5000 mg | ORAL_TABLET | Freq: Three times a day (TID) | ORAL | 2 refills | Status: DC | PRN

## 2018-08-25 NOTE — Progress Notes (Signed)
Subjective:    Patient ID: Alison White, female    DOB: [birth year], 63 y.o.   MRN: [id number]  Chief Complaint  Patient presents with  . anxiety recheck   PT presents to the office today to recheck GAD. She tried Lexapro, but gave her chest pain, nausea, weakness. She stopped that medication and restarted her ativan. She states this works well, but does not "last". She reports she is taking 1/2 tab of 0.5 mg of Ativan at night and in the morning.  Anxiety  Presents for follow-up visit. Symptoms include decreased concentration, excessive worry, irritability, nervous/anxious behavior, panic and restlessness. Symptoms occur occasionally. The severity of symptoms is moderate.        Review of Systems  Constitutional: Positive for irritability.  Psychiatric/Behavioral: Positive for decreased concentration. The patient is nervous/anxious.   All other systems reviewed and are negative.      Objective:   Physical Exam Vitals signs reviewed.  Constitutional:      General: She is not in acute distress.    Appearance: She is well-developed.  HENT:     Head: Normocephalic and atraumatic.     Right Ear: Tympanic membrane normal.     Left Ear: Tympanic membrane normal.  Eyes:     Pupils: Pupils are equal, round, and reactive to light.  Neck:     Musculoskeletal: Normal range of motion and neck supple.     Thyroid: No thyromegaly.  Cardiovascular:     Rate and Rhythm: Normal rate and regular rhythm.     Heart sounds: Normal heart sounds. No murmur.  Pulmonary:     Effort: Pulmonary effort is normal. No respiratory distress.     Breath sounds: Normal breath sounds. No wheezing.  Abdominal:     General: Bowel sounds are normal. There is no distension.     Palpations: Abdomen is soft.     Tenderness: There is no abdominal tenderness.  Musculoskeletal: Normal range of motion.        General: No tenderness.  Skin:    General: Skin is warm and dry.  Neurological:     Mental  Status: She is alert and oriented to person, place, and time.     Cranial Nerves: No cranial nerve deficit.     Deep Tendon Reflexes: Reflexes are normal and symmetric.  Psychiatric:        Behavior: Behavior normal.        Thought Content: Thought content normal.        Judgment: Judgment normal.     BP (!) 141/94   Pulse 80   Temp 97.6 F (36.4 C) (Oral)   Ht 5' (1.524 m)   Wt 165 lb 9.6 oz (75.1 kg)   BMI 32.34 kg/m      Assessment & Plan:  Alison White comes in today with chief complaint of anxiety recheck   Diagnosis and orders addressed:  1. GAD (generalized anxiety disorder) Will add buspar today Pt hopes she will start her job back and this will help with her anxiety. Discussed Ativan was for short term and she is aware.  Stress management discussed Pt reviewed in [place] controlled database- No red flags RTO in 2 months  - LORazepam (ATIVAN) 0.5 MG tablet; Take 0.5-1 tablets (0.25-0.5 mg total) by mouth at bedtime.  Dispense: 30 tablet; Refill: 1 - busPIRone (BUSPAR) 7.5 MG tablet; Take 1 tablet (7.5 mg total) by mouth 3 (three) times daily as needed.  Dispense: 60 tablet; Refill:  [place], FNP

## 2018-08-25 NOTE — Patient Instructions (Signed)

## 2018-09-25 ENCOUNTER — Encounter: Payer: Self-pay | Admitting: Family

## 2018-09-25 ENCOUNTER — Ambulatory Visit (INDEPENDENT_AMBULATORY_CARE_PROVIDER_SITE_OTHER): Payer: 59 | Admitting: Family

## 2018-09-25 DIAGNOSIS — R232 Flushing: Secondary | ICD-10-CM

## 2018-09-25 DIAGNOSIS — F411 Generalized anxiety disorder: Secondary | ICD-10-CM | POA: Diagnosis not present

## 2018-09-25 MED ORDER — VENLAFAXINE HCL ER 37.5 MG PO CP24: 37.5000 mg | ORAL_CAPSULE | Freq: Every day | ORAL | 1 refills | Status: DC

## 2018-09-25 NOTE — Progress Notes (Signed)
   Virtual Visit via telephone Note  I connected with [name] on 09/25/18 at 3:27 pm by telephone and verified that I am speaking with the correct person using two identifiers. ANJALEE COPE is currently located at home and no one  is currently with her during visit. The provider, Evelina Dun, FNP is located in their office at time of visit.  I discussed the limitations, risks, security and privacy concerns of performing an evaluation and management service by telephone and the availability of in person appointments. I also discussed with the patient that there may be a patient responsible charge related to this service. The patient expressed understanding and agreed to proceed.   History and Present Illness:  Pt calls the office today with increase hot flashes. She states she had a hysterectomy at age 63. She states she started having hot flashes about 8-10 years ago, but over the last few months it has worsen.    She feels like her anxiety is worse because of her hot flashes.  Anxiety Presents for follow-up visit. Symptoms include decreased concentration, depressed mood, excessive worry, irritability, nervous/anxious behavior and restlessness. The severity of symptoms is moderate. The quality of sleep is good.        Review of Systems  Constitutional: Positive for irritability.  Psychiatric/Behavioral: Positive for decreased concentration. The patient is nervous/anxious.      Observations/Objective: No SOB or distress noted   Assessment and Plan: RYLEEANN URQUIZA comes in today with chief complaint of No chief complaint on file.   Diagnosis and orders addressed:  1. GAD (generalized anxiety disorder) Will start Effexor 37.6 mg today Stress management discussed - venlafaxine XR (EFFEXOR XR) 37.5 MG 24 hr capsule; Take 1 capsule (37.5 mg total) by mouth daily with breakfast.  Dispense: 90 capsule; Refill: 1  2. Hot flashes Continue Black Cohosh  Not a candidate for  hormone therapy related to her age Will start Effexor today - venlafaxine XR (EFFEXOR XR) 37.5 MG 24 hr capsule; Take 1 capsule (37.5 mg total) by mouth daily with breakfast.  Dispense: 90 capsule; Refill: 1    I discussed the assessment and treatment plan with the patient. The patient was provided an opportunity to ask questions and all were answered. The patient agreed with the plan and demonstrated an understanding of the instructions.   The patient was advised to call back or seek an in-person evaluation if the symptoms worsen or if the condition fails to improve as anticipated.  The above assessment and management plan was discussed with the patient. The patient verbalized understanding of and has agreed to the management plan. Patient is aware to call the clinic if symptoms persist or worsen. Patient is aware when to return to the clinic for a follow-up visit. Patient educated on when it is appropriate to go to the emergency department.   Time call ended:  3:48 pm  I provided 21 minutes of non-face-to-face time during this encounter.    Evelina Dun, FNP

## 2018-10-26 ENCOUNTER — Ambulatory Visit: Payer: 59 | Admitting: Family

## 2018-10-29 ENCOUNTER — Other Ambulatory Visit: Payer: Self-pay

## 2018-10-30 ENCOUNTER — Encounter: Payer: Self-pay | Admitting: Family

## 2018-10-30 ENCOUNTER — Ambulatory Visit: Payer: 59 | Admitting: Family

## 2018-10-30 VITALS — BP 158/98 | HR 77 | Temp 97.2°F | Ht 60.0 in | Wt 166.4 lb

## 2018-10-30 DIAGNOSIS — F411 Generalized anxiety disorder: Secondary | ICD-10-CM

## 2018-10-30 DIAGNOSIS — E781 Pure hyperglyceridemia: Secondary | ICD-10-CM | POA: Diagnosis not present

## 2018-10-30 DIAGNOSIS — E669 Obesity, unspecified: Secondary | ICD-10-CM | POA: Diagnosis not present

## 2018-10-30 DIAGNOSIS — Z0001 Encounter for general adult medical examination with abnormal findings: Secondary | ICD-10-CM | POA: Diagnosis not present

## 2018-10-30 DIAGNOSIS — Z0000 Encounter for general adult medical examination without abnormal findings: Secondary | ICD-10-CM

## 2018-10-30 DIAGNOSIS — Z79899 Other long term (current) drug therapy: Secondary | ICD-10-CM

## 2018-10-30 DIAGNOSIS — F1320 Sedative, hypnotic or anxiolytic dependence, uncomplicated: Secondary | ICD-10-CM | POA: Insufficient documentation

## 2018-10-30 MED ORDER — ATORVASTATIN CALCIUM 40 MG PO TABS: 40.0000 mg | ORAL_TABLET | Freq: Every day | ORAL | 11 refills | Status: DC

## 2018-10-30 MED ORDER — BUSPIRONE HCL 7.5 MG PO TABS: 7.5000 mg | ORAL_TABLET | Freq: Three times a day (TID) | ORAL | 6 refills | Status: DC | PRN

## 2018-10-30 MED ORDER — LORAZEPAM 0.5 MG PO TABS: 0.2500 mg | ORAL_TABLET | Freq: Every day | ORAL | 3 refills | Status: DC

## 2018-10-30 NOTE — Progress Notes (Signed)
Subjective:    Patient ID: Alison White, female    DOB: Apr 02, 1955, 63 y.o.   MRN: [id number]  Chief Complaint  Patient presents with  . Medical Management of Chronic Issues    refills   Pt presents to the office today for CPE. Pt states her anxiety has improved.  She states she continues to have hot flashes, but is taking black cohosh and OTC supplements that help.  Anxiety Presents for follow-up visit. Symptoms include decreased concentration, excessive worry, insomnia, irritability, nervous/anxious behavior and restlessness. Symptoms occur most days. The severity of symptoms is moderate. The quality of sleep is good.        Review of Systems  Constitutional: Positive for irritability.  Psychiatric/Behavioral: Positive for decreased concentration. The patient is nervous/anxious and has insomnia.   All other systems reviewed and are negative.      Objective:   Physical Exam Vitals signs reviewed.  Constitutional:      General: She is not in acute distress.    Appearance: She is well-developed.  HENT:     Head: Normocephalic and atraumatic.     Right Ear: Tympanic membrane normal.     Left Ear: Tympanic membrane normal.  Eyes:     Pupils: Pupils are equal, round, and reactive to light.  Neck:     Musculoskeletal: Normal range of motion and neck supple.     Thyroid: No thyromegaly.  Cardiovascular:     Rate and Rhythm: Normal rate and regular rhythm.     Heart sounds: Normal heart sounds. No murmur.  Pulmonary:     Effort: Pulmonary effort is normal. No respiratory distress.     Breath sounds: Normal breath sounds. No wheezing.  Abdominal:     General: Bowel sounds are normal. There is no distension.     Palpations: Abdomen is soft.     Tenderness: There is no abdominal tenderness.  Musculoskeletal: Normal range of motion.        General: No tenderness.  Skin:    General: Skin is warm and dry.  Neurological:     Mental Status: She is alert and oriented to  person, place, and time.     Cranial Nerves: No cranial nerve deficit.     Deep Tendon Reflexes: Reflexes are normal and symmetric.  Psychiatric:        Behavior: Behavior normal.        Thought Content: Thought content normal.        Judgment: Judgment normal.       BP (!) 158/98   Pulse 77   Temp (!) 97.2 F (36.2 C) (Oral)   Ht 5' (1.524 m)   Wt 166 lb 6.4 oz (75.5 kg)   BMI 32.50 kg/m      Assessment & Plan:  Alison White comes in today with chief complaint of Medical Management of Chronic Issues (refills)   Diagnosis and orders addressed:  1. GAD (generalized anxiety disorder) - LORazepam (ATIVAN) 0.5 MG tablet; Take 0.5-1 tablets (0.25-0.5 mg total) by mouth at bedtime.  Dispense: 30 tablet; Refill: 3 - busPIRone (BUSPAR) 7.5 MG tablet; Take 1 tablet (7.5 mg total) by mouth 3 (three) times daily as needed.  Dispense: 90 tablet; Refill: 6 - CBC with Differential/Platelet - CMP14+EGFR - ToxASSURE Select 13 (MW), Urine  2. Hypertriglyceridemia - CBC with Differential/Platelet - CMP14+EGFR - Lipid panel  3. Obesity (BMI 30-39.9) - CBC with Differential/Platelet - CMP14+EGFR  4. Benzodiazepine dependence (HCC) - CBC with Differential/Platelet -  CMP14+EGFR - ToxASSURE Select 13 (MW), Urine Pt reviewed in [place] controlled database- No red flags noted, pt gets Ativan every other month   5. Controlled substance agreement signed - CBC with Differential/Platelet - CMP14+EGFR - ToxASSURE Select 13 (MW), Urine  6. Annual physical exam - CBC with Differential/Platelet - CMP14+EGFR - Lipid panel - TSH   Labs pending Health Maintenance reviewed Diet and exercise encouraged  Follow up plan: 6 months   Alison Dun, FNP

## 2018-10-30 NOTE — Patient Instructions (Signed)

## 2018-10-31 LAB — CBC WITH DIFFERENTIAL/PLATELET
Basophils Absolute: 0 10*3/uL (ref 0.0–0.2)
Basos: 0 %
EOS (ABSOLUTE): 0.1 10*3/uL (ref 0.0–0.4)
Eos: 1 %
Hematocrit: 41.4 % (ref 34.0–46.6)
Hemoglobin: 13.7 g/dL (ref 11.1–15.9)
Immature Grans (Abs): 0 10*3/uL (ref 0.0–0.1)
Immature Granulocytes: 0 %
Lymphocytes Absolute: 1.5 10*3/uL (ref 0.7–3.1)
Lymphs: 19 %
MCH: 32.8 pg (ref 26.6–33.0)
MCHC: 33.1 g/dL (ref 31.5–35.7)
MCV: 99 fL — ABNORMAL HIGH (ref 79–97)
Monocytes Absolute: 0.6 10*3/uL (ref 0.1–0.9)
Monocytes: 8 %
Neutrophils Absolute: 5.7 10*3/uL (ref 1.4–7.0)
Neutrophils: 72 %
Platelets: 268 10*3/uL (ref 150–450)
RBC: 4.18 x10E6/uL (ref 3.77–5.28)
RDW: 12 % (ref 11.7–15.4)
WBC: 7.8 10*3/uL (ref 3.4–10.8)

## 2018-10-31 LAB — CMP14+EGFR
ALT: 25 IU/L (ref 0–32)
AST: 27 IU/L (ref 0–40)
Albumin/Globulin Ratio: 2 (ref 1.2–2.2)
Albumin: 4.7 g/dL (ref 3.8–4.8)
Alkaline Phosphatase: 89 IU/L (ref 39–117)
BUN/Creatinine Ratio: 13 (ref 12–28)
BUN: 11 mg/dL (ref 8–27)
Bilirubin Total: 0.4 mg/dL (ref 0.0–1.2)
CO2: 24 mmol/L (ref 20–29)
Calcium: 9.6 mg/dL (ref 8.7–10.3)
Chloride: 104 mmol/L (ref 96–106)
Creatinine, Ser: 0.86 mg/dL (ref 0.57–1.00)
GFR calc Af Amer: 83 mL/min/{1.73_m2} (ref >59)
GFR calc non Af Amer: 72 mL/min/{1.73_m2} (ref >59)
Globulin, Total: 2.3 g/dL (ref 1.5–4.5)
Glucose: 92 mg/dL (ref 65–99)
Potassium: 4.2 mmol/L (ref 3.5–5.2)
Sodium: 144 mmol/L (ref 134–144)
Total Protein: 7 g/dL (ref 6.0–8.5)

## 2018-10-31 LAB — TSH: TSH: 0.994 u[IU]/mL (ref 0.450–4.500)

## 2018-10-31 LAB — LIPID PANEL
Chol/HDL Ratio: 2.7 ratio (ref 0.0–4.4)
Cholesterol, Total: 157 mg/dL (ref 100–199)
HDL: 59 mg/dL (ref >39)
LDL Calculated: 66 mg/dL (ref 0–99)
Triglycerides: 159 mg/dL — ABNORMAL HIGH (ref 0–149)
VLDL Cholesterol Cal: 32 mg/dL (ref 5–40)

## 2018-11-01 LAB — TOXASSURE SELECT 13 (MW), URINE

## 2018-11-17 ENCOUNTER — Telehealth: Payer: Self-pay | Admitting: Family

## 2018-11-17 NOTE — Telephone Encounter (Signed)
Pt aware of labs  

## 2018-12-22 ENCOUNTER — Other Ambulatory Visit: Payer: Self-pay

## 2018-12-23 ENCOUNTER — Ambulatory Visit (INDEPENDENT_AMBULATORY_CARE_PROVIDER_SITE_OTHER): Payer: 59

## 2018-12-23 DIAGNOSIS — Z23 Encounter for immunization: Secondary | ICD-10-CM

## 2019-09-08 ENCOUNTER — Other Ambulatory Visit: Payer: Self-pay | Admitting: Family

## 2019-10-02 ENCOUNTER — Other Ambulatory Visit: Payer: Self-pay | Admitting: Family

## 2019-10-04 NOTE — Telephone Encounter (Signed)
[facility]. NTBS 30 days given 09/08/19 LOV 10/2018

## 2019-10-04 NOTE — Telephone Encounter (Signed)
lmtcb to schedule follow up to get refills on medications.

## 2019-10-29 ENCOUNTER — Encounter: Payer: Self-pay | Admitting: Family

## 2019-10-29 ENCOUNTER — Other Ambulatory Visit: Payer: Self-pay

## 2019-10-29 ENCOUNTER — Ambulatory Visit: Payer: No Typology Code available for payment source | Admitting: Family

## 2019-10-29 VITALS — BP 134/82 | HR 86 | Temp 98.3°F | Ht 60.0 in | Wt 163.0 lb

## 2019-10-29 DIAGNOSIS — F1320 Sedative, hypnotic or anxiolytic dependence, uncomplicated: Secondary | ICD-10-CM

## 2019-10-29 DIAGNOSIS — Z79899 Other long term (current) drug therapy: Secondary | ICD-10-CM | POA: Diagnosis not present

## 2019-10-29 DIAGNOSIS — E781 Pure hyperglyceridemia: Secondary | ICD-10-CM

## 2019-10-29 DIAGNOSIS — E669 Obesity, unspecified: Secondary | ICD-10-CM

## 2019-10-29 DIAGNOSIS — Z0001 Encounter for general adult medical examination with abnormal findings: Secondary | ICD-10-CM | POA: Diagnosis not present

## 2019-10-29 DIAGNOSIS — F411 Generalized anxiety disorder: Secondary | ICD-10-CM

## 2019-10-29 DIAGNOSIS — Z0000 Encounter for general adult medical examination without abnormal findings: Secondary | ICD-10-CM

## 2019-10-29 DIAGNOSIS — R232 Flushing: Secondary | ICD-10-CM

## 2019-10-29 MED ORDER — ATORVASTATIN CALCIUM 40 MG PO TABS: 40.0000 mg | ORAL_TABLET | Freq: Every day | ORAL | 0 refills | Status: DC

## 2019-10-29 MED ORDER — BUSPIRONE HCL 7.5 MG PO TABS: 7.5000 mg | ORAL_TABLET | Freq: Three times a day (TID) | ORAL | 6 refills | Status: DC | PRN

## 2019-10-29 NOTE — Patient Instructions (Signed)
Health Maintenance, Female Adopting a healthy lifestyle and getting preventive care are important in promoting health and wellness. Ask your health care provider about:  The right schedule for you to have regular tests and exams.  Things you can do on your own to prevent diseases and keep yourself healthy. What should I know about diet, weight, and exercise? Eat a healthy diet   Eat a diet that includes plenty of vegetables, fruits, low-fat dairy products, and lean protein.  Do not eat a lot of foods that are high in solid fats, added sugars, or sodium. Maintain a healthy weight Body mass index (BMI) is used to identify weight problems. It estimates body fat based on height and weight. Your health care provider can help determine your BMI and help you achieve or maintain a healthy weight. Get regular exercise Get regular exercise. This is one of the most important things you can do for your health. Most adults should:  Exercise for at least 150 minutes each week. The exercise should increase your heart rate and make you sweat (moderate-intensity exercise).  Do strengthening exercises at least twice a week. This is in addition to the moderate-intensity exercise.  Spend less time sitting. Even light physical activity can be beneficial. Watch cholesterol and blood lipids Have your blood tested for lipids and cholesterol at 64 years of age, then have this test every 5 years. Have your cholesterol levels checked more often if:  Your lipid or cholesterol levels are high.  You are older than 64 years of age.  You are at high risk for heart disease. What should I know about cancer screening? Depending on your health history and family history, you may need to have cancer screening at various ages. This may include screening for:  Breast cancer.  Cervical cancer.  Colorectal cancer.  Skin cancer.  Lung cancer. What should I know about heart disease, diabetes, and high blood  pressure? Blood pressure and heart disease  High blood pressure causes heart disease and increases the risk of stroke. This is more likely to develop in people who have high blood pressure readings, are of African descent, or are overweight.  Have your blood pressure checked: ? Every 3-5 years if you are 18-39 years of age. ? Every year if you are 40 years old or older. Diabetes Have regular diabetes screenings. This checks your fasting blood sugar level. Have the screening done:  Once every three years after age 40 if you are at a normal weight and have a low risk for diabetes.  More often and at a younger age if you are overweight or have a high risk for diabetes. What should I know about preventing infection? Hepatitis B If you have a higher risk for hepatitis B, you should be screened for this virus. Talk with your health care provider to find out if you are at risk for hepatitis B infection. Hepatitis C Testing is recommended for:  Everyone born from 1945 through 1965.  Anyone with known risk factors for hepatitis C. Sexually transmitted infections (STIs)  Get screened for STIs, including gonorrhea and chlamydia, if: ? You are sexually active and are younger than 64 years of age. ? You are older than 64 years of age and your health care provider tells you that you are at risk for this type of infection. ? Your sexual activity has changed since you were last screened, and you are at increased risk for chlamydia or gonorrhea. Ask your health care provider if   you are at risk.  Ask your health care provider about whether you are at high risk for HIV. Your health care provider may recommend a prescription medicine to help prevent HIV infection. If you choose to take medicine to prevent HIV, you should first get tested for HIV. You should then be tested every 3 months for as long as you are taking the medicine. Pregnancy  If you are about to stop having your period (premenopausal) and  you may become pregnant, seek counseling before you get pregnant.  Take 400 to 800 micrograms (mcg) of folic acid every day if you become pregnant.  Ask for birth control (contraception) if you want to prevent pregnancy. Osteoporosis and menopause Osteoporosis is a disease in which the bones lose minerals and strength with aging. This can result in bone fractures. If you are 65 years old or older, or if you are at risk for osteoporosis and fractures, ask your health care provider if you should:  Be screened for bone loss.  Take a calcium or vitamin D supplement to lower your risk of fractures.  Be given hormone replacement therapy (HRT) to treat symptoms of menopause. Follow these instructions at home: Lifestyle  Do not use any products that contain nicotine or tobacco, such as cigarettes, e-cigarettes, and chewing tobacco. If you need help quitting, ask your health care provider.  Do not use street drugs.  Do not share needles.  Ask your health care provider for help if you need support or information about quitting drugs. Alcohol use  Do not drink alcohol if: ? Your health care provider tells you not to drink. ? You are pregnant, may be pregnant, or are planning to become pregnant.  If you drink alcohol: ? Limit how much you use to 0-1 drink a day. ? Limit intake if you are breastfeeding.  Be aware of how much alcohol is in your drink. In the [place] one drink equals one 12 oz bottle of beer (355 mL), one 5 oz glass of wine (148 mL), or one 1 oz glass of hard liquor (44 mL). General instructions  Schedule regular health, dental, and eye exams.  Stay current with your vaccines.  Tell your health care provider if: ? You often feel depressed. ? You have ever been abused or do not feel safe at home. Summary  Adopting a healthy lifestyle and getting preventive care are important in promoting health and wellness.  Follow your health care provider's instructions about healthy  diet, exercising, and getting tested or screened for diseases.  Follow your health care provider's instructions on monitoring your cholesterol and blood pressure. This information is not intended to replace advice given to you by your health care provider. Make sure you discuss any questions you have with your health care provider. Document Revised: 02/25/2018 Document Reviewed: 02/25/2018 Elsevier Patient Education  2020 [employer]  

## 2019-10-29 NOTE — Progress Notes (Signed)
Subjective:    Patient ID: Alison White, female    DOB: [birth year], 64 y.o.   MRN: [id number]  Chief Complaint  Patient presents with  . Medical Management of Chronic Issues   Pt presents to the office today for CPE without pap.  She states she continues to have hot flashes, but is taking black cohosh and OTC supplements that help.  Anxiety Presents for follow-up visit. Symptoms include excessive worry, irritability, nervous/anxious behavior and restlessness. Symptoms occur occasionally. The severity of symptoms is moderate.    Hyperlipidemia This is a chronic problem. The current episode started more than 1 year ago. The problem is controlled. Recent lipid tests were reviewed and are normal. Exacerbating diseases include obesity. Current antihyperlipidemic treatment includes statins. The current treatment provides moderate improvement of lipids. Risk factors for coronary artery disease include dyslipidemia, a sedentary lifestyle and post-menopausal.      Review of Systems  Constitutional: Positive for irritability.  Psychiatric/Behavioral: The patient is nervous/anxious.   All other systems reviewed and are negative.  Family History  Problem Relation Age of Onset  . Alzheimer's disease Mother   . Cancer Father        prostate   Social History   Socioeconomic History  . Marital status: Widowed    Spouse name: Not on file  . Number of children: Not on file  . Years of education: Not on file  . Highest education level: Not on file  Occupational History  . Not on file  Tobacco Use  . Smoking status: Former Smoker    Quit date: 03/18/1997    Years since quitting: 22.6  . Smokeless tobacco: Never Used  Vaping Use  . Vaping Use: Never used  Substance and Sexual Activity  . Alcohol use: Yes    Comment: Drinks 4 beer daily.  . Drug use: No  . Sexual activity: Not on file  Other Topics Concern  . Not on file  Social History Narrative  . Not on file   Social  Determinants of Health   Financial Resource Strain:   . Difficulty of Paying Living Expenses:   Food Insecurity:   . Worried About Charity fundraiser in the Last Year:   . Arboriculturist in the Last Year:   Transportation Needs:   . Film/video editor (Medical):   Marland Kitchen Lack of Transportation (Non-Medical):   Physical Activity:   . Days of Exercise per Week:   . Minutes of Exercise per Session:   Stress:   . Feeling of Stress :   Social Connections:   . Frequency of Communication with Friends and Family:   . Frequency of Social Gatherings with Friends and Family:   . Attends Religious Services:   . Active Member of Clubs or Organizations:   . Attends Archivist Meetings:   Marland Kitchen Marital Status:        Objective:   Physical Exam Vitals reviewed.  Constitutional:      General: She is not in acute distress.    Appearance: She is well-developed.  HENT:     Head: Normocephalic and atraumatic.     Right Ear: Tympanic membrane normal.     Left Ear: Tympanic membrane normal.  Eyes:     Pupils: Pupils are equal, round, and reactive to light.  Neck:     Thyroid: No thyromegaly.  Cardiovascular:     Rate and Rhythm: Normal rate and regular rhythm.     Heart sounds:  Normal heart sounds. No murmur heard.   Pulmonary:     Effort: Pulmonary effort is normal. No respiratory distress.     Breath sounds: Normal breath sounds. No wheezing.  Abdominal:     General: Bowel sounds are normal. There is no distension.     Palpations: Abdomen is soft.     Tenderness: There is no abdominal tenderness.  Musculoskeletal:        General: No tenderness. Normal range of motion.     Cervical back: Normal range of motion and neck supple.  Skin:    General: Skin is warm and dry.  Neurological:     Mental Status: She is alert and oriented to person, place, and time.     Cranial Nerves: No cranial nerve deficit.     Deep Tendon Reflexes: Reflexes are normal and symmetric.  Psychiatric:         Behavior: Behavior normal.        Thought Content: Thought content normal.        Judgment: Judgment normal.       BP 134/82   Pulse 86   Temp 98.3 F (36.8 C) (Temporal)   Ht 5' (1.524 m)   Wt 163 lb (73.9 kg)   SpO2 96%   BMI 31.83 kg/m      Assessment & Plan:  Alison White comes in today with chief complaint of Medical Management of Chronic Issues   Diagnosis and orders addressed:  1. GAD (generalized anxiety disorder) - busPIRone (BUSPAR) 7.5 MG tablet; Take 1 tablet (7.5 mg total) by mouth 3 (three) times daily as needed.  Dispense: 90 tablet; Refill: 6 - CMP14+EGFR - CBC with Differential/Platelet  2. Controlled substance agreement signed - CMP14+EGFR - CBC with Differential/Platelet  3. Benzodiazepine dependence (HCC) - CMP14+EGFR - CBC with Differential/Platelet  4. Hot flashe - CMP14+EGFR - CBC with Differential/Platelet  5. Hypertriglyceridemia - CMP14+EGFR - CBC with Differential/Platelet - Lipid panel  6. Obesity (BMI 30-39.9) - CMP14+EGFR - CBC with Differential/Platelet  7. Annual physical exam - CMP14+EGFR - CBC with Differential/Platelet - Lipid panel - TSH   Labs pending Health Maintenance reviewed Diet and exercise encouraged  Follow up plan 1 year   Evelina Dun, FNP

## 2019-10-30 LAB — CMP14+EGFR
ALT: 23 IU/L (ref 0–32)
AST: 29 IU/L (ref 0–40)
Albumin/Globulin Ratio: 2 (ref 1.2–2.2)
Albumin: 4.9 g/dL — ABNORMAL HIGH (ref 3.8–4.8)
Alkaline Phosphatase: 100 IU/L (ref 48–121)
BUN/Creatinine Ratio: 18 (ref 12–28)
BUN: 14 mg/dL (ref 8–27)
Bilirubin Total: 0.4 mg/dL (ref 0.0–1.2)
CO2: 20 mmol/L (ref 20–29)
Calcium: 9.6 mg/dL (ref 8.7–10.3)
Chloride: 105 mmol/L (ref 96–106)
Creatinine, Ser: 0.77 mg/dL (ref 0.57–1.00)
GFR calc Af Amer: 94 mL/min/{1.73_m2} (ref >59)
GFR calc non Af Amer: 82 mL/min/{1.73_m2} (ref >59)
Globulin, Total: 2.5 g/dL (ref 1.5–4.5)
Glucose: 91 mg/dL (ref 65–99)
Potassium: 4.2 mmol/L (ref 3.5–5.2)
Sodium: 141 mmol/L (ref 134–144)
Total Protein: 7.4 g/dL (ref 6.0–8.5)

## 2019-10-30 LAB — TSH: TSH: 1.06 u[IU]/mL (ref 0.450–4.500)

## 2019-10-30 LAB — CBC WITH DIFFERENTIAL/PLATELET
Basophils Absolute: 0 10*3/uL (ref 0.0–0.2)
Basos: 1 %
EOS (ABSOLUTE): 0.1 10*3/uL (ref 0.0–0.4)
Eos: 1 %
Hematocrit: 40 % (ref 34.0–46.6)
Hemoglobin: 13.3 g/dL (ref 11.1–15.9)
Immature Grans (Abs): 0 10*3/uL (ref 0.0–0.1)
Immature Granulocytes: 0 %
Lymphocytes Absolute: 1.3 10*3/uL (ref 0.7–3.1)
Lymphs: 16 %
MCH: 33.8 pg — ABNORMAL HIGH (ref 26.6–33.0)
MCHC: 33.3 g/dL (ref 31.5–35.7)
MCV: 102 fL — ABNORMAL HIGH (ref 79–97)
Monocytes Absolute: 0.7 10*3/uL (ref 0.1–0.9)
Monocytes: 8 %
Neutrophils Absolute: 6.4 10*3/uL (ref 1.4–7.0)
Neutrophils: 74 %
Platelets: 287 10*3/uL (ref 150–450)
RBC: 3.93 x10E6/uL (ref 3.77–5.28)
RDW: 12.8 % (ref 11.7–15.4)
WBC: 8.5 10*3/uL (ref 3.4–10.8)

## 2019-10-30 LAB — LIPID PANEL
Chol/HDL Ratio: 3.9 ratio (ref 0.0–4.4)
Cholesterol, Total: 208 mg/dL — ABNORMAL HIGH (ref 100–199)
HDL: 54 mg/dL (ref >39)
LDL Chol Calc (NIH): 100 mg/dL — ABNORMAL HIGH (ref 0–99)
Triglycerides: 319 mg/dL — ABNORMAL HIGH (ref 0–149)
VLDL Cholesterol Cal: 54 mg/dL — ABNORMAL HIGH (ref 5–40)

## 2019-11-20 ENCOUNTER — Other Ambulatory Visit: Payer: Self-pay | Admitting: Family

## 2019-12-31 ENCOUNTER — Other Ambulatory Visit: Payer: Self-pay

## 2019-12-31 ENCOUNTER — Ambulatory Visit (INDEPENDENT_AMBULATORY_CARE_PROVIDER_SITE_OTHER): Payer: No Typology Code available for payment source

## 2019-12-31 DIAGNOSIS — Z23 Encounter for immunization: Secondary | ICD-10-CM

## 2020-01-28 ENCOUNTER — Other Ambulatory Visit: Payer: Self-pay | Admitting: Family

## 2020-04-27 ENCOUNTER — Other Ambulatory Visit: Payer: Self-pay

## 2020-04-27 ENCOUNTER — Ambulatory Visit (INDEPENDENT_AMBULATORY_CARE_PROVIDER_SITE_OTHER): Payer: No Typology Code available for payment source | Admitting: Family Medicine

## 2020-04-27 ENCOUNTER — Encounter: Payer: Self-pay | Admitting: Family Medicine

## 2020-04-27 VITALS — BP 135/81 | HR 88 | Temp 98.1°F | Ht 60.0 in | Wt 167.8 lb

## 2020-04-27 DIAGNOSIS — M791 Myalgia, unspecified site: Secondary | ICD-10-CM | POA: Diagnosis not present

## 2020-04-27 DIAGNOSIS — M19041 Primary osteoarthritis, right hand: Secondary | ICD-10-CM | POA: Diagnosis not present

## 2020-04-27 DIAGNOSIS — M19042 Primary osteoarthritis, left hand: Secondary | ICD-10-CM

## 2020-04-27 DIAGNOSIS — M2550 Pain in unspecified joint: Secondary | ICD-10-CM | POA: Diagnosis not present

## 2020-04-27 DIAGNOSIS — D1722 Benign lipomatous neoplasm of skin and subcutaneous tissue of left arm: Secondary | ICD-10-CM

## 2020-04-27 MED ORDER — MELOXICAM 7.5 MG PO TABS: 7.5000 mg | ORAL_TABLET | Freq: Every day | ORAL | 5 refills | Status: DC

## 2020-04-27 NOTE — Progress Notes (Signed)
Established Patient Office Visit  Subjective:  Patient ID: Alison White, female    DOB: 11-10-1955  Age: 65 y.o. MRN: [id number]  CC:  Chief Complaint  Patient presents with  . Mass    Left forearm  . Arthritis    HPI Alison White presents:  1. Mass on left forearm Alison White has had a lipoma on her left forearm for 16-18 years. About 6 months ago this started to get larger and become a little painful. She also has pain in her elbow from this and some numbness and tingling in her arm because of it. The pain and numbness and tingling comes and goes. She denies erythema.   2. Arthritis Alison White reports arthritis in both hand, knees, and her back. She has been taking aleve for her pain but the pain has worsened. She reports the pain is worse in the morning or after sitting. The pain improves when she moves around. She feels like her muscle ache as well.   Past Medical History:  Diagnosis Date  . Anxiety     Past Surgical History:  Procedure Laterality Date  . ABDOMINAL HYSTERECTOMY    . APPENDECTOMY    . CHOLECYSTECTOMY    . KNEE CARTILAGE SURGERY Left     Family History  Problem Relation Age of Onset  . Alzheimer's disease Mother   . Cancer Father        prostate    Social History   Socioeconomic History  . Marital status: Widowed    Spouse name: Not on file  . Number of children: Not on file  . Years of education: Not on file  . Highest education level: Not on file  Occupational History  . Not on file  Tobacco Use  . Smoking status: Former Smoker    Quit date: 03/18/1997    Years since quitting: 23.1  . Smokeless tobacco: Never Used  Vaping Use  . Vaping Use: Never used  Substance and Sexual Activity  . Alcohol use: Yes    Comment: Drinks 4 beer daily.  . Drug use: No  . Sexual activity: Not on file  Other Topics Concern  . Not on file  Social History Narrative  . Not on file   Social Determinants of Health   Financial Resource Strain: Not on file   Food Insecurity: Not on file  Transportation Needs: Not on file  Physical Activity: Not on file  Stress: Not on file  Social Connections: Not on file  Intimate Partner Violence: Not on file    Outpatient Medications Prior to Visit  Medication Sig Dispense Refill  . atorvastatin (LIPITOR) 40 MG tablet TAKE 1 TABLET BY MOUTH EVERY DAY 90 tablet 0  . BLACK COHOSH PO Take by mouth.    . busPIRone (BUSPAR) 7.5 MG tablet Take 1 tablet (7.5 mg total) by mouth 3 (three) times daily as needed. 90 tablet 6   No facility-administered medications prior to visit.    Allergies  Allergen Reactions  . Lexapro [Escitalopram Oxalate]     Felt chest pain, nausea, shaking with  rubber legs.    ROS Review of Systems Negative unless specially indicated above in HPI.   Objective:    Physical Exam Vitals and nursing note reviewed.  Constitutional:      General: She is not in acute distress.    Appearance: She is not ill-appearing, toxic-appearing or diaphoretic.  HENT:     Head: Normocephalic and atraumatic.  Cardiovascular:  Rate and Rhythm: Normal rate and regular rhythm.     Heart sounds: Normal heart sounds. No murmur heard.   Pulmonary:     Effort: Pulmonary effort is normal. No respiratory distress.     Breath sounds: Normal breath sounds.  Musculoskeletal:       Arms:     Cervical back: Neck supple. No rigidity.     Right lower leg: No edema.     Left lower leg: No edema.  Skin:    General: Skin is warm and dry.  Neurological:     General: No focal deficit present.     Mental Status: She is alert and oriented to person, place, and time.  Psychiatric:        Mood and Affect: Mood normal.        Behavior: Behavior normal.     BP 135/81   Pulse 88   Temp 98.1 F (36.7 C)   Ht 5' (1.524 m)   Wt 167 lb 12.8 oz (76.1 kg)   SpO2 95%   BMI 32.77 kg/m  Wt Readings from Last 3 Encounters:  04/27/20 167 lb 12.8 oz (76.1 kg)  10/29/19 163 lb (73.9 kg)  10/30/18 166 lb  6.4 oz (75.5 kg)     There are no preventive care reminders to display for this patient.  There are no preventive care reminders to display for this patient.  Lab Results  Component Value Date   TSH 1.060 10/29/2019   Lab Results  Component Value Date   WBC 8.5 10/29/2019   HGB 13.3 10/29/2019   HCT 40.0 10/29/2019   MCV 102 (H) 10/29/2019   PLT 287 10/29/2019   Lab Results  Component Value Date   NA 141 10/29/2019   K 4.2 10/29/2019   CO2 20 10/29/2019   GLUCOSE 91 10/29/2019   BUN 14 10/29/2019   CREATININE 0.77 10/29/2019   BILITOT 0.4 10/29/2019   ALKPHOS 100 10/29/2019   AST 29 10/29/2019   ALT 23 10/29/2019   PROT 7.4 10/29/2019   ALBUMIN 4.9 (H) 10/29/2019   CALCIUM 9.6 10/29/2019   Lab Results  Component Value Date   CHOL 208 (H) 10/29/2019   Lab Results  Component Value Date   HDL 54 10/29/2019   Lab Results  Component Value Date   LDLCALC 100 (H) 10/29/2019   Lab Results  Component Value Date   TRIG 319 (H) 10/29/2019   Lab Results  Component Value Date   CHOLHDL 3.9 10/29/2019   No results found for: HGBA1C    Assessment & Plan:   Alison White was seen today for mass and arthritis.  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 Referral placed to discuss possible removal.  -     Ambulatory referral to General Surgery  Arthritis of both hands Mobic ordered. Do not take aleve with mobic. Patient declined PT today.  -     meloxicam (MOBIC) 7.5 MG tablet; Take 1 tablet (7.5 mg total) by mouth daily.  Multiple joint pain Mobic ordered. Declined PT today. Known OA in knees, back, and hands. Labs pending as below, will notify patient of results.  -     meloxicam (MOBIC) 7.5 MG tablet; Take 1 tablet (7.5 mg total) by mouth daily. -     Arthritis Panel -     CBC with Differential/Platelet -  CMP14+EGFR -     ANA,IFA RA Diag Pnl w/rflx Tit/Patn  Myalgia New problem. Labs pending as below.  -     meloxicam (MOBIC) 7.5 MG tablet; Take 1 tablet (7.5 mg total) by mouth daily. -     Arthritis Panel -     CBC with Differential/Platelet -     CMP14+EGFR -     ANA,IFA RA Diag Pnl w/rflx Tit/Patn   Follow-up: Return if symptoms worsen or fail to improve.   The patient indicates understanding of these issues and agrees with the plan.  Gwenlyn Perking, FNP

## 2020-04-27 NOTE — Patient Instructions (Signed)
Arthritis Arthritis is a term that is commonly used to refer to joint pain or joint disease. There are more than 100 types of arthritis. What are the causes? The most common cause of this condition is wear and tear of a joint. Other causes include:  Gout.  Inflammation of a joint.  An infection of a joint.  Sprains and other injuries near the joint.  A reaction to medicines or drugs, or an allergic reaction. In some cases, the cause may not be known. What are the signs or symptoms? The main symptom of this condition is pain in the joint during movement. Other symptoms include:  Redness, swelling, or stiffness at a joint.  Warmth coming from the joint.  Fever.  Overall feeling of illness. How is this diagnosed? This condition may be diagnosed with a physical exam and tests, including:  Blood tests.  Urine tests.  Imaging tests, such as X-rays, an MRI, or a CT scan. Sometimes, fluid is removed from a joint for testing. How is this treated? This condition may be treated with:  Treatment of the cause, if it is known.  Rest.  Raising (elevating) the joint.  Applying cold or hot packs to the joint.  Medicines to improve symptoms and reduce inflammation.  Injections of a steroid such as cortisone into the joint to help reduce pain and inflammation. Depending on the cause of your arthritis, you may need to make lifestyle changes to reduce stress on your joint. Changes may include:  Exercising more.  Losing weight. Follow these instructions at home: Medicines  Take over-the-counter and prescription medicines only as told by your health care provider.  Do not take aspirin to relieve pain if your health care provider thinks that gout may be causing your pain. Activity  Rest your joint if told by your health care provider. Rest is important when your disease is active and your joint feels painful, swollen, or stiff.  Avoid activities that make the pain worse. It is  important to balance activity with rest.  Exercise your joint regularly with range-of-motion exercises as told by your health care provider. Try doing low-impact exercise, such as: ? Swimming. ? Water aerobics. ? Biking. ? Walking. Managing pain, stiffness, and swelling  If directed, put ice on the joint. ? Put ice in a plastic bag. ? Place a towel between your skin and the bag. ? Leave the ice on for 20 minutes, 2-3 times per day.  If your joint is swollen, raise (elevate) it above the level of your heart if directed by your health care provider.  If your joint feels stiff in the morning, try taking a warm shower.  If directed, apply heat to the affected area as often as told by your health care provider. Use the heat source that your health care provider recommends, such as a moist heat pack or a heating pad. If you have diabetes, do not apply heat without permission from your health care provider. To apply heat: ? Place a towel between your skin and the heat source. ? Leave the heat on for 20-30 minutes. ? Remove the heat if your skin turns bright red. This is especially important if you are unable to feel pain, heat, or cold. You may have a greater risk of getting burned.      General instructions  Do not use any products that contain nicotine or tobacco, such as cigarettes, e-cigarettes, and chewing tobacco. If you need help quitting, ask your health care provider.    Keep all follow-up visits as told by your health care provider. This is important. Contact a health care provider if:  The pain gets worse.  You have a fever. Get help right away if:  You develop severe joint pain, swelling, or redness.  Many joints become painful and swollen.  You develop severe back pain.  You develop severe weakness in your leg.  You cannot control your bladder or bowels. Summary  Arthritis is a term that is commonly used to refer to joint pain or joint disease. There are more than  100 types of arthritis.  The most common cause of this condition is wear and tear of a joint. Other causes include gout, inflammation or infection of the joint, sprains, or allergies.  Symptoms of this condition include redness, swelling, or stiffness of the joint. Other symptoms include warmth, fever, or feeling ill.  This condition is treated with rest, elevation, medicines, and applying cold or hot packs.  Follow your health care provider's instructions about medicines, activity, exercises, and other home care treatments. This information is not intended to replace advice given to you by your health care provider. Make sure you discuss any questions you have with your health care provider. Document Revised: 02/09/2018 Document Reviewed: 02/09/2018 Elsevier Patient Education  2021 [employer]  

## 2020-05-01 LAB — CMP14+EGFR
ALT: 23 IU/L (ref 0–32)
AST: 28 IU/L (ref 0–40)
Albumin/Globulin Ratio: 1.8 (ref 1.2–2.2)
Albumin: 4.7 g/dL (ref 3.8–4.8)
Alkaline Phosphatase: 110 IU/L (ref 44–121)
BUN/Creatinine Ratio: 19 (ref 12–28)
BUN: 15 mg/dL (ref 8–27)
Bilirubin Total: 0.4 mg/dL (ref 0.0–1.2)
CO2: 20 mmol/L (ref 20–29)
Calcium: 10.3 mg/dL (ref 8.7–10.3)
Chloride: 105 mmol/L (ref 96–106)
Creatinine, Ser: 0.79 mg/dL (ref 0.57–1.00)
GFR calc Af Amer: 91 mL/min/{1.73_m2} (ref >59)
GFR calc non Af Amer: 79 mL/min/{1.73_m2} (ref >59)
Globulin, Total: 2.6 g/dL (ref 1.5–4.5)
Glucose: 107 mg/dL — ABNORMAL HIGH (ref 65–99)
Potassium: 4.2 mmol/L (ref 3.5–5.2)
Sodium: 142 mmol/L (ref 134–144)
Total Protein: 7.3 g/dL (ref 6.0–8.5)

## 2020-05-01 LAB — ARTHRITIS PANEL
Basophils Absolute: 0 10*3/uL (ref 0.0–0.2)
Basos: 1 %
EOS (ABSOLUTE): 0.1 10*3/uL (ref 0.0–0.4)
Eos: 2 %
Hematocrit: 40.2 % (ref 34.0–46.6)
Hemoglobin: 13.7 g/dL (ref 11.1–15.9)
Immature Grans (Abs): 0 10*3/uL (ref 0.0–0.1)
Immature Granulocytes: 0 %
Lymphocytes Absolute: 1.2 10*3/uL (ref 0.7–3.1)
Lymphs: 20 %
MCH: 33.5 pg — ABNORMAL HIGH (ref 26.6–33.0)
MCHC: 34.1 g/dL (ref 31.5–35.7)
MCV: 98 fL — ABNORMAL HIGH (ref 79–97)
Monocytes Absolute: 0.6 10*3/uL (ref 0.1–0.9)
Monocytes: 9 %
Neutrophils Absolute: 4.2 10*3/uL (ref 1.4–7.0)
Neutrophils: 68 %
Platelets: 305 10*3/uL (ref 150–450)
RBC: 4.09 x10E6/uL (ref 3.77–5.28)
RDW: 12.3 % (ref 11.7–15.4)
Rheumatoid fact SerPl-aCnc: <10 IU/mL (ref <14.0)
Sed Rate: 14 mm/hr (ref 0–40)
Uric Acid: 5.1 mg/dL (ref 3.0–7.2)
WBC: 6.2 10*3/uL (ref 3.4–10.8)

## 2020-05-01 LAB — ANA,IFA RA DIAG PNL W/RFLX TIT/PATN
ANA Titer 1: NEGATIVE
Cyclic Citrullin Peptide Ab: 8 units (ref 0–19)

## 2020-05-02 NOTE — Progress Notes (Signed)
Patient aware of labs. Verbalized understanding 

## 2020-07-18 ENCOUNTER — Other Ambulatory Visit: Payer: Self-pay | Admitting: Family

## 2020-07-21 ENCOUNTER — Encounter: Payer: Self-pay | Admitting: Family Medicine

## 2020-08-15 ENCOUNTER — Ambulatory Visit: Payer: No Typology Code available for payment source | Admitting: Family

## 2020-08-25 ENCOUNTER — Ambulatory Visit: Payer: No Typology Code available for payment source | Admitting: Family

## 2020-08-25 ENCOUNTER — Encounter: Payer: Self-pay | Admitting: Family

## 2020-08-25 ENCOUNTER — Other Ambulatory Visit: Payer: Self-pay

## 2020-08-25 VITALS — BP 144/81 | HR 84 | Temp 98.2°F | Ht 60.0 in | Wt 167.4 lb

## 2020-08-25 DIAGNOSIS — M19041 Primary osteoarthritis, right hand: Secondary | ICD-10-CM | POA: Diagnosis not present

## 2020-08-25 DIAGNOSIS — M19042 Primary osteoarthritis, left hand: Secondary | ICD-10-CM

## 2020-08-25 DIAGNOSIS — F411 Generalized anxiety disorder: Secondary | ICD-10-CM | POA: Diagnosis not present

## 2020-08-25 DIAGNOSIS — M159 Polyosteoarthritis, unspecified: Secondary | ICD-10-CM

## 2020-08-25 DIAGNOSIS — E781 Pure hyperglyceridemia: Secondary | ICD-10-CM

## 2020-08-25 DIAGNOSIS — M2550 Pain in unspecified joint: Secondary | ICD-10-CM

## 2020-08-25 DIAGNOSIS — E669 Obesity, unspecified: Secondary | ICD-10-CM

## 2020-08-25 DIAGNOSIS — M791 Myalgia, unspecified site: Secondary | ICD-10-CM

## 2020-08-25 MED ORDER — MELOXICAM 7.5 MG PO TABS
7.5000 mg | ORAL_TABLET | Freq: Every day | ORAL | 1 refills | Status: DC
Start: 2020-08-25 — End: 2021-05-08

## 2020-08-25 MED ORDER — BUSPIRONE HCL 7.5 MG PO TABS: 7.5000 mg | ORAL_TABLET | Freq: Three times a day (TID) | ORAL | 6 refills | Status: DC | PRN

## 2020-08-25 MED ORDER — ROSUVASTATIN CALCIUM 5 MG PO TABS: 5.0000 mg | ORAL_TABLET | ORAL | 3 refills | Status: DC

## 2020-08-25 NOTE — Progress Notes (Signed)
Subjective:    Patient ID: Alison White, female    DOB: Apr 10, 1955, 65 y.o.   MRN: [id number]  Chief Complaint  Patient presents with   Medical Management of Chronic Issues   Pt presents to the office today for chronic follow up. Her BP is slightly elevated today, but states at home her BP is 124/78.   She reports the Lipitor is causing leg pain and wants to stop the medication.  Hypertension This is a chronic problem. The current episode started more than 1 year ago. The problem has been waxing and waning since onset. The problem is controlled. Associated symptoms include anxiety. Pertinent negatives include no malaise/fatigue, peripheral edema or shortness of breath. Risk factors for coronary artery disease include dyslipidemia, obesity and sedentary lifestyle. The current treatment provides moderate improvement.  Hyperlipidemia This is a chronic problem. The current episode started more than 1 year ago. The problem is controlled. Exacerbating diseases include obesity. Pertinent negatives include no shortness of breath. Current antihyperlipidemic treatment includes statins. The current treatment provides moderate improvement of lipids. Risk factors for coronary artery disease include dyslipidemia, hypertension, a sedentary lifestyle and post-menopausal.  Anxiety Presents for follow-up visit. Symptoms include depressed mood, excessive worry, irritability and nervous/anxious behavior. Patient reports no shortness of breath. Symptoms occur most days. The severity of symptoms is moderate.    Arthritis Presents for follow-up visit. She complains of pain, stiffness and joint swelling. The symptoms have been stable. Affected locations include the left MCP, right MCP, left knee and right knee. Her pain is at a severity of 3/10 (now since taking mobic).     Review of Systems  Constitutional:  Positive for irritability. Negative for malaise/fatigue.  Respiratory:  Negative for shortness of  breath.   Musculoskeletal:  Positive for arthritis, joint swelling and stiffness.  Psychiatric/Behavioral:  The patient is nervous/anxious.   All other systems reviewed and are negative.     Objective:   Physical Exam Vitals reviewed.  Constitutional:      General: She is not in acute distress.    Appearance: She is well-developed. She is obese.  HENT:     Head: Normocephalic and atraumatic.     Right Ear: Tympanic membrane normal.     Left Ear: Tympanic membrane normal.  Eyes:     Pupils: Pupils are equal, round, and reactive to light.  Neck:     Thyroid: No thyromegaly.  Cardiovascular:     Rate and Rhythm: Normal rate and regular rhythm.     Heart sounds: Normal heart sounds. No murmur heard. Pulmonary:     Effort: Pulmonary effort is normal. No respiratory distress.     Breath sounds: Normal breath sounds. No wheezing.  Abdominal:     General: Bowel sounds are normal. There is no distension.     Palpations: Abdomen is soft.     Tenderness: There is no abdominal tenderness.  Musculoskeletal:        General: Tenderness present. Normal range of motion.     Cervical back: Normal range of motion and neck supple.  Skin:    General: Skin is warm and dry.  Neurological:     Mental Status: She is alert and oriented to person, place, and time.     Cranial Nerves: No cranial nerve deficit.     Deep Tendon Reflexes: Reflexes are normal and symmetric.  Psychiatric:        Behavior: Behavior normal.        Thought Content: Thought  content normal.        Judgment: Judgment normal.      BP (!) 144/81   Pulse 84   Temp 98.2 F (36.8 C) (Temporal)   Ht 5' (1.524 m)   Wt 167 lb 6.4 oz (75.9 kg)   BMI 32.69 kg/m      Assessment & Plan:   Alison White comes in today with chief complaint of Medical Management of Chronic Issues   Diagnosis and orders addressed:  1. Arthritis of both hands - meloxicam (MOBIC) 7.5 MG tablet; Take 1 tablet (7.5 mg total) by mouth daily.   Dispense: 90 tablet; Refill: 1 - CBC with Differential/Platelet - CMP14+EGFR  2. Myalgia - meloxicam (MOBIC) 7.5 MG tablet; Take 1 tablet (7.5 mg total) by mouth daily.  Dispense: 90 tablet; Refill: 1 - CBC with Differential/Platelet - CMP14+EGFR  3. Multiple joint pain - meloxicam (MOBIC) 7.5 MG tablet; Take 1 tablet (7.5 mg total) by mouth daily.  Dispense: 90 tablet; Refill: 1 - CBC with Differential/Platelet - CMP14+EGFR  4. GAD (generalized anxiety disorder) - busPIRone (BUSPAR) 7.5 MG tablet; Take 1 tablet (7.5 mg total) by mouth 3 (three) times daily as needed.  Dispense: 90 tablet; Refill: 6 - CBC with Differential/Platelet - CMP14+EGFR  5. Hypertriglyceridemia Will change Lipitor to Crestor.  Crestor 5 mg every other day. If pain continues change to M, W, & F. If pain continues change to weekly.  - rosuvastatin (CRESTOR) 5 MG tablet; Take 1 tablet (5 mg total) by mouth every other day.  Dispense: 45 tablet; Refill: 3 - CBC with Differential/Platelet - CMP14+EGFR  6. Obesity (BMI 30-39.9) - CBC with Differential/Platelet - CMP14+EGFR  7. Osteoarthritis of multiple joints, unspecified osteoarthritis type - CBC with Differential/Platelet - CMP14+EGFR   Labs pending Health Maintenance reviewed Diet and exercise encouraged  Follow up plan: 6 months    Evelina Dun, FNP

## 2020-08-25 NOTE — Patient Instructions (Signed)
Osteoarthritis Osteoarthritis is a type of arthritis. It refers to joint pain or joint disease. Osteoarthritis affects tissue that covers the ends of bones in joints (cartilage). Cartilage acts as a cushion between the bones and helps them move smoothly. Osteoarthritis occurs when cartilage in the joints gets worn down. Osteoarthritis is sometimes called "wear and tear" arthritis. Osteoarthritis is the most common form of arthritis. It often occurs in older people. It is a condition that gets worse over time. The joints most often affected by this condition are in the fingers, toes, hips, knees, and spine, including the neck and lower back. What are the causes? This condition is caused by the wearing down of cartilage that covers the ends of bones. What increases the risk? The following factors may make you more likely to develop this condition: Being age 50 or older. Obesity. Overuse of joints. Past injury of a joint. Past surgery on a joint. Family history of osteoarthritis. What are the signs or symptoms? The main symptoms of this condition are pain, swelling, and stiffness in the joint. Other symptoms may include: An enlarged joint. More pain and further damage caused by small pieces of bone or cartilage that break off and float inside of the joint. Small deposits of bone (osteophytes) that grow on the edges of the joint. A grating or scraping feeling inside the joint when you move it. Popping or creaking sounds when you move. Difficulty walking or exercising. An inability to grip items, twist your hand(s), or control the movements of your hands and fingers. How is this diagnosed? This condition may be diagnosed based on: Your medical history. A physical exam. Your symptoms. X-rays of the affected joint(s). Blood tests to rule out other types of arthritis. How is this treated? There is no cure for this condition, but treatment can help control pain and improve joint function.  Treatment may include a combination of therapies, such as: Pain relief techniques, such as: Applying heat and cold to the joint. Massage. A form of talk therapy called cognitive behavioral therapy (CBT). This therapy helps you set goals and follow up on the changes that you make. Medicines for pain and inflammation. The medicines can be taken by mouth or applied to the skin. They include: NSAIDs, such as ibuprofen. Prescription medicines. Strong anti-inflammatory medicines (corticosteroids). Certain nutritional supplements. A prescribed exercise program. You may work with a physical therapist. Assistive devices, such as a brace, wrap, splint, specialized glove, or cane. A weight control plan. Surgery, such as: An osteotomy. This is done to reposition the bones and relieve pain or to remove loose pieces of bone and cartilage. Joint replacement surgery. You may need this surgery if you have advanced osteoarthritis. Follow these instructions at home: Activity Rest your affected joints as told by your health care provider. Exercise as told by your health care provider. He or she may recommend specific types of exercise, such as: Strengthening exercises. These are done to strengthen the muscles that support joints affected by arthritis. Aerobic activities. These are exercises, such as brisk walking or water aerobics, that increase your heart rate. Range-of-motion activities. These help your joints move more easily. Balance and agility exercises. Managing pain, stiffness, and swelling   If directed, apply heat to the affected area as often as told by your health care provider. Use the heat source that your health care provider recommends, such as a moist heat pack or a heating pad. If you have a removable assistive device, remove it as told by   your health care provider. Place a towel between your skin and the heat source. If your health care provider tells you to keep the assistive device on  while you apply heat, place a towel between the assistive device and the heat source. Leave the heat on for 20-30 minutes. Remove the heat if your skin turns bright red. This is especially important if you are unable to feel pain, heat, or cold. You may have a greater risk of getting burned. If directed, put ice on the affected area. To do this: If you have a removable assistive device, remove it as told by your health care provider. Put ice in a plastic bag. Place a towel between your skin and the bag. If your health care provider tells you to keep the assistive device on during icing, place a towel between the assistive device and the bag. Leave the ice on for 20 minutes, 2-3 times a day. Move your fingers or toes often to reduce stiffness and swelling. Raise (elevate) the injured area above the level of your heart while you are sitting or lying down. General instructions Take over-the-counter and prescription medicines only as told by your health care provider. Maintain a healthy weight. Follow instructions from your health care provider for weight control. Do not use any products that contain nicotine or tobacco, such as cigarettes, e-cigarettes, and chewing tobacco. If you need help quitting, ask your health care provider. Use assistive devices as told by your health care provider. Keep all follow-up visits as told by your health care provider. This is important. Where to find more information National Institute of Arthritis and Musculoskeletal and Skin Diseases: www.niams.nih.gov National Institute on Aging: www.nia.nih.gov American College of Rheumatology: www.rheumatology.org Contact a health care provider if: You have redness, swelling, or a feeling of warmth in a joint that gets worse. You have a fever along with joint or muscle aches. You develop a rash. You have trouble doing your normal activities. Get help right away if: You have pain that gets worse and is not relieved by  pain medicine. Summary Osteoarthritis is a type of arthritis that affects tissue covering the ends of bones in joints (cartilage). This condition is caused by the wearing down of cartilage that covers the ends of bones. The main symptom of this condition is pain, swelling, and stiffness in the joint. There is no cure for this condition, but treatment can help control pain and improve joint function. This information is not intended to replace advice given to you by your health care provider. Make sure you discuss any questions you have with your health care provider. Document Revised: 03/01/2019 Document Reviewed: 03/01/2019 Elsevier Patient Education  2022 [employer] Inc.  

## 2020-08-26 LAB — CBC WITH DIFFERENTIAL/PLATELET
Basophils Absolute: 0 10*3/uL (ref 0.0–0.2)
Basos: 0 %
EOS (ABSOLUTE): 0.1 10*3/uL (ref 0.0–0.4)
Eos: 1 %
Hematocrit: 42.3 % (ref 34.0–46.6)
Hemoglobin: 13.8 g/dL (ref 11.1–15.9)
Immature Grans (Abs): 0 10*3/uL (ref 0.0–0.1)
Immature Granulocytes: 0 %
Lymphocytes Absolute: 1.5 10*3/uL (ref 0.7–3.1)
Lymphs: 19 %
MCH: 32.4 pg (ref 26.6–33.0)
MCHC: 32.6 g/dL (ref 31.5–35.7)
MCV: 99 fL — ABNORMAL HIGH (ref 79–97)
Monocytes Absolute: 0.7 10*3/uL (ref 0.1–0.9)
Monocytes: 9 %
Neutrophils Absolute: 5.6 10*3/uL (ref 1.4–7.0)
Neutrophils: 71 %
Platelets: 287 10*3/uL (ref 150–450)
RBC: 4.26 x10E6/uL (ref 3.77–5.28)
RDW: 12.4 % (ref 11.7–15.4)
WBC: 7.9 10*3/uL (ref 3.4–10.8)

## 2020-08-26 LAB — CMP14+EGFR
ALT: 25 IU/L (ref 0–32)
AST: 23 IU/L (ref 0–40)
Albumin/Globulin Ratio: 2.1 (ref 1.2–2.2)
Albumin: 5.2 g/dL — ABNORMAL HIGH (ref 3.8–4.8)
Alkaline Phosphatase: 100 IU/L (ref 44–121)
BUN/Creatinine Ratio: 19 (ref 12–28)
BUN: 13 mg/dL (ref 8–27)
Bilirubin Total: 0.5 mg/dL (ref 0.0–1.2)
CO2: 22 mmol/L (ref 20–29)
Calcium: 10.4 mg/dL — ABNORMAL HIGH (ref 8.7–10.3)
Chloride: 106 mmol/L (ref 96–106)
Creatinine, Ser: 0.7 mg/dL (ref 0.57–1.00)
Globulin, Total: 2.5 g/dL (ref 1.5–4.5)
Glucose: 92 mg/dL (ref 65–99)
Potassium: 4.5 mmol/L (ref 3.5–5.2)
Sodium: 144 mmol/L (ref 134–144)
Total Protein: 7.7 g/dL (ref 6.0–8.5)
eGFR: 97 mL/min/{1.73_m2} (ref >59)

## 2020-09-29 ENCOUNTER — Other Ambulatory Visit: Payer: Self-pay

## 2020-09-29 ENCOUNTER — Ambulatory Visit (INDEPENDENT_AMBULATORY_CARE_PROVIDER_SITE_OTHER): Payer: No Typology Code available for payment source

## 2020-09-29 DIAGNOSIS — Z23 Encounter for immunization: Secondary | ICD-10-CM

## 2020-10-02 ENCOUNTER — Telehealth: Payer: Self-pay | Admitting: Family

## 2020-10-02 NOTE — Telephone Encounter (Signed)
Spoke with patient she describes a local reaction at injection site told to take benadryl and cool compress. Call back if it tried to spread

## 2020-12-29 ENCOUNTER — Ambulatory Visit (INDEPENDENT_AMBULATORY_CARE_PROVIDER_SITE_OTHER): Payer: No Typology Code available for payment source

## 2020-12-29 ENCOUNTER — Other Ambulatory Visit: Payer: Self-pay

## 2020-12-29 DIAGNOSIS — Z23 Encounter for immunization: Secondary | ICD-10-CM

## 2021-01-30 ENCOUNTER — Ambulatory Visit (INDEPENDENT_AMBULATORY_CARE_PROVIDER_SITE_OTHER): Payer: No Typology Code available for payment source | Admitting: *Deleted

## 2021-01-30 ENCOUNTER — Other Ambulatory Visit: Payer: Self-pay

## 2021-01-30 DIAGNOSIS — Z23 Encounter for immunization: Secondary | ICD-10-CM | POA: Diagnosis not present

## 2021-05-02 ENCOUNTER — Other Ambulatory Visit: Payer: Self-pay | Admitting: Family Medicine

## 2021-05-02 DIAGNOSIS — M791 Myalgia, unspecified site: Secondary | ICD-10-CM

## 2021-05-02 DIAGNOSIS — M2550 Pain in unspecified joint: Secondary | ICD-10-CM

## 2021-05-02 DIAGNOSIS — M19041 Primary osteoarthritis, right hand: Secondary | ICD-10-CM

## 2021-05-02 DIAGNOSIS — M19042 Primary osteoarthritis, left hand: Secondary | ICD-10-CM

## 2021-05-08 ENCOUNTER — Ambulatory Visit: Payer: No Typology Code available for payment source | Admitting: Family

## 2021-05-08 ENCOUNTER — Encounter: Payer: Self-pay | Admitting: Family

## 2021-05-08 VITALS — BP 143/71 | HR 83 | Temp 98.3°F | Ht 60.0 in | Wt 171.4 lb

## 2021-05-08 DIAGNOSIS — M159 Polyosteoarthritis, unspecified: Secondary | ICD-10-CM

## 2021-05-08 DIAGNOSIS — M19041 Primary osteoarthritis, right hand: Secondary | ICD-10-CM | POA: Diagnosis not present

## 2021-05-08 DIAGNOSIS — E781 Pure hyperglyceridemia: Secondary | ICD-10-CM

## 2021-05-08 DIAGNOSIS — M2550 Pain in unspecified joint: Secondary | ICD-10-CM | POA: Diagnosis not present

## 2021-05-08 DIAGNOSIS — M791 Myalgia, unspecified site: Secondary | ICD-10-CM | POA: Diagnosis not present

## 2021-05-08 DIAGNOSIS — F411 Generalized anxiety disorder: Secondary | ICD-10-CM

## 2021-05-08 DIAGNOSIS — Z0001 Encounter for general adult medical examination with abnormal findings: Secondary | ICD-10-CM

## 2021-05-08 DIAGNOSIS — M19042 Primary osteoarthritis, left hand: Secondary | ICD-10-CM

## 2021-05-08 DIAGNOSIS — E669 Obesity, unspecified: Secondary | ICD-10-CM

## 2021-05-08 DIAGNOSIS — Z0000 Encounter for general adult medical examination without abnormal findings: Secondary | ICD-10-CM

## 2021-05-08 DIAGNOSIS — Z780 Asymptomatic menopausal state: Secondary | ICD-10-CM

## 2021-05-08 MED ORDER — MELOXICAM 7.5 MG PO TABS: 7.5000 mg | ORAL_TABLET | Freq: Every day | ORAL | 1 refills | Status: DC

## 2021-05-08 NOTE — Progress Notes (Signed)
Subjective:    Patient ID: Alison White, female    DOB: 08-Dec-1955, 66 y.o.   MRN: [id number]  Chief Complaint  Patient presents with   Medical Management of Chronic Issues   Pt presents to the office today for CPE without pap and chronic follow up. Her BP is slightly elevated today, but states at home her BP is 134/68.   Anxiety Presents for follow-up visit. Symptoms include excessive worry, irritability and nervous/anxious behavior. Symptoms occur rarely. The severity of symptoms is mild.    Hyperlipidemia This is a chronic problem. The current episode started more than 1 year ago. The problem is controlled. Current antihyperlipidemic treatment includes statins. The current treatment provides moderate improvement of lipids. Risk factors for coronary artery disease include dyslipidemia, a sedentary lifestyle and post-menopausal.  Arthritis Presents for follow-up visit. She complains of pain and stiffness. Affected locations include the left knee, right knee, left MCP and right MCP (back). Her pain is at a severity of 8/10.     Review of Systems  Constitutional:  Positive for irritability.  Musculoskeletal:  Positive for arthritis and stiffness.  Psychiatric/Behavioral:  The patient is nervous/anxious.   All other systems reviewed and are negative.     Family History  Problem Relation Age of Onset   Alzheimer's disease Mother    Cancer Father        prostate   Social History   Socioeconomic History   Marital status: Widowed    Spouse name: Not on file   Number of children: Not on file   Years of education: Not on file   Highest education level: Not on file  Occupational History   Not on file  Tobacco Use   Smoking status: Former    Types: Cigarettes    Quit date: 03/18/1997    Years since quitting: 24.1   Smokeless tobacco: Never  Vaping Use   Vaping Use: Never used  Substance and Sexual Activity   Alcohol use: Yes    Comment: Drinks 4 beer daily.   Drug use:  No   Sexual activity: Not on file  Other Topics Concern   Not on file  Social History Narrative   Not on file   Social Determinants of Health   Financial Resource Strain: Not on file  Food Insecurity: Not on file  Transportation Needs: Not on file  Physical Activity: Not on file  Stress: Not on file  Social Connections: Not on file    Objective:   Physical Exam Vitals reviewed.  Constitutional:      General: She is not in acute distress.    Appearance: She is well-developed. She is obese.  HENT:     Head: Normocephalic and atraumatic.     Right Ear: Tympanic membrane normal.     Left Ear: Tympanic membrane normal.  Eyes:     Pupils: Pupils are equal, round, and reactive to light.  Neck:     Thyroid: No thyromegaly.  Cardiovascular:     Rate and Rhythm: Normal rate and regular rhythm.     Heart sounds: Normal heart sounds. No murmur heard. Pulmonary:     Effort: Pulmonary effort is normal. No respiratory distress.     Breath sounds: Normal breath sounds. No wheezing.  Abdominal:     General: Bowel sounds are normal. There is no distension.     Palpations: Abdomen is soft.     Tenderness: There is no abdominal tenderness.  Musculoskeletal:  General: No tenderness. Normal range of motion.     Cervical back: Normal range of motion and neck supple.  Skin:    General: Skin is warm and dry.  Neurological:     Mental Status: She is alert and oriented to person, place, and time.     Cranial Nerves: No cranial nerve deficit.     Deep Tendon Reflexes: Reflexes are normal and symmetric.  Psychiatric:        Behavior: Behavior normal.        Thought Content: Thought content normal.        Judgment: Judgment normal.      BP (!) 143/71    Pulse 83    Temp 98.3 F (36.8 C) (Temporal)    Ht 5' (1.524 m)    Wt 171 lb 6.4 oz (77.7 kg)    BMI 33.47 kg/m      Assessment & Plan:  Alison White comes in today with chief complaint of Medical Management of Chronic  Issues   Diagnosis and orders addressed:  1. Arthritis of both hands - meloxicam (MOBIC) 7.5 MG tablet; Take 1 tablet (7.5 mg total) by mouth daily.  Dispense: 90 tablet; Refill: 1 - CMP14+EGFR - CBC with Differential/Platelet  2. Myalgia - meloxicam (MOBIC) 7.5 MG tablet; Take 1 tablet (7.5 mg total) by mouth daily.  Dispense: 90 tablet; Refill: 1 - CMP14+EGFR - CBC with Differential/Platelet  3. Multiple joint pain - meloxicam (MOBIC) 7.5 MG tablet; Take 1 tablet (7.5 mg total) by mouth daily.  Dispense: 90 tablet; Refill: 1 - CMP14+EGFR - CBC with Differential/Platelet  4. Osteoarthritis of multiple joints, unspecified osteoarthritis type - CMP14+EGFR - CBC with Differential/Platelet  5. Obesity (BMI 30-39.9) - CMP14+EGFR - CBC with Differential/Platelet  6. Hypertriglyceridemia - CMP14+EGFR - CBC with Differential/Platelet - Lipid panel  7. GAD (generalized anxiety disorder) - CMP14+EGFR - CBC with Differential/Platelet  8. Annual physical exam - CMP14+EGFR - CBC with Differential/Platelet - Lipid panel - TSH - DG [facility] DEXA  9. Post-menopausal - CMP14+EGFR - CBC with Differential/Platelet - DG [facility] DEXA   Labs pending Health Maintenance reviewed Diet and exercise encouraged  Follow up plan: 6 months    Evelina Dun, FNP

## 2021-05-08 NOTE — Patient Instructions (Signed)
Osteoarthritis Osteoarthritis is a type of arthritis. It refers to joint pain or joint disease. Osteoarthritis affects tissue that covers the ends of bones in joints (cartilage). Cartilage acts as a cushion between the bones and helps them move smoothly. Osteoarthritis occurs when cartilage in the joints gets worn down. Osteoarthritis is sometimes called "wear and tear" arthritis. Osteoarthritis is the most common form of arthritis. It often occurs in older people. It is a condition that gets worse over time. The joints most often affected by this condition are in the fingers, toes, hips, knees, and spine, including the neck and lower back. What are the causes? This condition is caused by the wearing down of cartilage that covers the ends of bones. What increases the risk? The following factors may make you more likely to develop this condition: Being age 50 or older. Obesity. Overuse of joints. Past injury of a joint. Past surgery on a joint. Family history of osteoarthritis. What are the signs or symptoms? The main symptoms of this condition are pain, swelling, and stiffness in the joint. Other symptoms may include: An enlarged joint. More pain and further damage caused by small pieces of bone or cartilage that break off and float inside of the joint. Small deposits of bone (osteophytes) that grow on the edges of the joint. A grating or scraping feeling inside the joint when you move it. Popping or creaking sounds when you move. Difficulty walking or exercising. An inability to grip items, twist your hand(s), or control the movements of your hands and fingers. How is this diagnosed? This condition may be diagnosed based on: Your medical history. A physical exam. Your symptoms. X-rays of the affected joint(s). Blood tests to rule out other types of arthritis. How is this treated? There is no cure for this condition, but treatment can help control pain and improve joint function.  Treatment may include a combination of therapies, such as: Pain relief techniques, such as: Applying heat and cold to the joint. Massage. A form of talk therapy called cognitive behavioral therapy (CBT). This therapy helps you set goals and follow up on the changes that you make. Medicines for pain and inflammation. The medicines can be taken by mouth or applied to the skin. They include: NSAIDs, such as ibuprofen. Prescription medicines. Strong anti-inflammatory medicines (corticosteroids). Certain nutritional supplements. A prescribed exercise program. You may work with a physical therapist. Assistive devices, such as a brace, wrap, splint, specialized glove, or cane. A weight control plan. Surgery, such as: An osteotomy. This is done to reposition the bones and relieve pain or to remove loose pieces of bone and cartilage. Joint replacement surgery. You may need this surgery if you have advanced osteoarthritis. Follow these instructions at home: Activity Rest your affected joints as told by your health care provider. Exercise as told by your health care provider. He or she may recommend specific types of exercise, such as: Strengthening exercises. These are done to strengthen the muscles that support joints affected by arthritis. Aerobic activities. These are exercises, such as brisk walking or water aerobics, that increase your heart rate. Range-of-motion activities. These help your joints move more easily. Balance and agility exercises. Managing pain, stiffness, and swelling   If directed, apply heat to the affected area as often as told by your health care provider. Use the heat source that your health care provider recommends, such as a moist heat pack or a heating pad. If you have a removable assistive device, remove it as told by   your health care provider. Place a towel between your skin and the heat source. If your health care provider tells you to keep the assistive device on  while you apply heat, place a towel between the assistive device and the heat source. Leave the heat on for 20-30 minutes. Remove the heat if your skin turns bright red. This is especially important if you are unable to feel pain, heat, or cold. You may have a greater risk of getting burned. If directed, put ice on the affected area. To do this: If you have a removable assistive device, remove it as told by your health care provider. Put ice in a plastic bag. Place a towel between your skin and the bag. If your health care provider tells you to keep the assistive device on during icing, place a towel between the assistive device and the bag. Leave the ice on for 20 minutes, 2-3 times a day. Move your fingers or toes often to reduce stiffness and swelling. Raise (elevate) the injured area above the level of your heart while you are sitting or lying down. General instructions Take over-the-counter and prescription medicines only as told by your health care provider. Maintain a healthy weight. Follow instructions from your health care provider for weight control. Do not use any products that contain nicotine or tobacco, such as cigarettes, e-cigarettes, and chewing tobacco. If you need help quitting, ask your health care provider. Use assistive devices as told by your health care provider. Keep all follow-up visits as told by your health care provider. This is important. Where to find more information National Institute of Arthritis and Musculoskeletal and Skin Diseases: www.niams.nih.gov National Institute on Aging: www.nia.nih.gov American College of Rheumatology: www.rheumatology.org Contact a health care provider if: You have redness, swelling, or a feeling of warmth in a joint that gets worse. You have a fever along with joint or muscle aches. You develop a rash. You have trouble doing your normal activities. Get help right away if: You have pain that gets worse and is not relieved by  pain medicine. Summary Osteoarthritis is a type of arthritis that affects tissue covering the ends of bones in joints (cartilage). This condition is caused by the wearing down of cartilage that covers the ends of bones. The main symptom of this condition is pain, swelling, and stiffness in the joint. There is no cure for this condition, but treatment can help control pain and improve joint function. This information is not intended to replace advice given to you by your health care provider. Make sure you discuss any questions you have with your health care provider. Document Revised: 03/01/2019 Document Reviewed: 03/01/2019 Elsevier Patient Education  2022 [employer] Inc.  

## 2021-05-09 LAB — CBC WITH DIFFERENTIAL/PLATELET
Basophils Absolute: 0 10*3/uL (ref 0.0–0.2)
Basos: 1 %
EOS (ABSOLUTE): 0.1 10*3/uL (ref 0.0–0.4)
Eos: 1 %
Hematocrit: 38.4 % (ref 34.0–46.6)
Hemoglobin: 12.8 g/dL (ref 11.1–15.9)
Immature Grans (Abs): 0 10*3/uL (ref 0.0–0.1)
Immature Granulocytes: 0 %
Lymphocytes Absolute: 1.4 10*3/uL (ref 0.7–3.1)
Lymphs: 18 %
MCH: 33.2 pg — ABNORMAL HIGH (ref 26.6–33.0)
MCHC: 33.3 g/dL (ref 31.5–35.7)
MCV: 100 fL — ABNORMAL HIGH (ref 79–97)
Monocytes Absolute: 0.7 10*3/uL (ref 0.1–0.9)
Monocytes: 9 %
Neutrophils Absolute: 5.4 10*3/uL (ref 1.4–7.0)
Neutrophils: 71 %
Platelets: 253 10*3/uL (ref 150–450)
RBC: 3.86 x10E6/uL (ref 3.77–5.28)
RDW: 12.7 % (ref 11.7–15.4)
WBC: 7.6 10*3/uL (ref 3.4–10.8)

## 2021-05-09 LAB — CMP14+EGFR
ALT: 19 IU/L (ref 0–32)
AST: 24 IU/L (ref 0–40)
Albumin/Globulin Ratio: 2 (ref 1.2–2.2)
Albumin: 4.5 g/dL (ref 3.8–4.8)
Alkaline Phosphatase: 93 IU/L (ref 44–121)
BUN/Creatinine Ratio: 17 (ref 12–28)
BUN: 15 mg/dL (ref 8–27)
Bilirubin Total: 0.2 mg/dL (ref 0.0–1.2)
CO2: 23 mmol/L (ref 20–29)
Calcium: 9.3 mg/dL (ref 8.7–10.3)
Chloride: 106 mmol/L (ref 96–106)
Creatinine, Ser: 0.88 mg/dL (ref 0.57–1.00)
Globulin, Total: 2.2 g/dL (ref 1.5–4.5)
Glucose: 94 mg/dL (ref 70–99)
Potassium: 4.2 mmol/L (ref 3.5–5.2)
Sodium: 145 mmol/L — ABNORMAL HIGH (ref 134–144)
Total Protein: 6.7 g/dL (ref 6.0–8.5)
eGFR: 73 mL/min/{1.73_m2} (ref >59)

## 2021-05-09 LAB — LIPID PANEL
Chol/HDL Ratio: 4.1 ratio (ref 0.0–4.4)
Cholesterol, Total: 201 mg/dL — ABNORMAL HIGH (ref 100–199)
HDL: 49 mg/dL (ref >39)
LDL Chol Calc (NIH): 97 mg/dL (ref 0–99)
Triglycerides: 328 mg/dL — ABNORMAL HIGH (ref 0–149)
VLDL Cholesterol Cal: 55 mg/dL — ABNORMAL HIGH (ref 5–40)

## 2021-05-09 LAB — TSH: TSH: 1.36 u[IU]/mL (ref 0.450–4.500)

## 2021-05-10 ENCOUNTER — Telehealth: Payer: Self-pay | Admitting: Family Medicine

## 2021-05-22 ENCOUNTER — Ambulatory Visit (INDEPENDENT_AMBULATORY_CARE_PROVIDER_SITE_OTHER): Payer: No Typology Code available for payment source

## 2021-05-22 DIAGNOSIS — Z780 Asymptomatic menopausal state: Secondary | ICD-10-CM

## 2021-05-28 ENCOUNTER — Other Ambulatory Visit: Payer: Self-pay | Admitting: Family

## 2021-05-28 DIAGNOSIS — M8580 Other specified disorders of bone density and structure, unspecified site: Secondary | ICD-10-CM | POA: Insufficient documentation

## 2021-05-28 NOTE — Progress Notes (Signed)
Patient calling back about results. Please call back.

## 2021-06-27 NOTE — Telephone Encounter (Signed)
error 

## 2021-08-02 DIAGNOSIS — K649 Unspecified hemorrhoids: Secondary | ICD-10-CM | POA: Insufficient documentation

## 2021-08-17 LAB — HM COLONOSCOPY

## 2021-09-12 ENCOUNTER — Other Ambulatory Visit: Payer: Self-pay | Admitting: Family

## 2021-09-12 DIAGNOSIS — E781 Pure hyperglyceridemia: Secondary | ICD-10-CM

## 2021-12-13 ENCOUNTER — Ambulatory Visit: Payer: No Typology Code available for payment source | Admitting: Family

## 2021-12-13 ENCOUNTER — Encounter: Payer: Self-pay | Admitting: Family

## 2021-12-13 VITALS — BP 136/73 | HR 71 | Temp 98.2°F | Ht 60.0 in | Wt 165.5 lb

## 2021-12-13 DIAGNOSIS — M8580 Other specified disorders of bone density and structure, unspecified site: Secondary | ICD-10-CM | POA: Diagnosis not present

## 2021-12-13 DIAGNOSIS — M159 Polyosteoarthritis, unspecified: Secondary | ICD-10-CM

## 2021-12-13 DIAGNOSIS — M19041 Primary osteoarthritis, right hand: Secondary | ICD-10-CM | POA: Diagnosis not present

## 2021-12-13 DIAGNOSIS — E781 Pure hyperglyceridemia: Secondary | ICD-10-CM

## 2021-12-13 DIAGNOSIS — M2550 Pain in unspecified joint: Secondary | ICD-10-CM

## 2021-12-13 DIAGNOSIS — M791 Myalgia, unspecified site: Secondary | ICD-10-CM | POA: Diagnosis not present

## 2021-12-13 DIAGNOSIS — M19042 Primary osteoarthritis, left hand: Secondary | ICD-10-CM

## 2021-12-13 DIAGNOSIS — E669 Obesity, unspecified: Secondary | ICD-10-CM

## 2021-12-13 DIAGNOSIS — F411 Generalized anxiety disorder: Secondary | ICD-10-CM

## 2021-12-13 MED ORDER — MELOXICAM 7.5 MG PO TABS: 7.5000 mg | ORAL_TABLET | Freq: Every day | ORAL | 1 refills | Status: DC

## 2021-12-13 MED ORDER — ROSUVASTATIN CALCIUM 5 MG PO TABS: 5.0000 mg | ORAL_TABLET | ORAL | 3 refills | Status: DC

## 2021-12-13 NOTE — Progress Notes (Signed)
Subjective:    Patient ID: Alison White, female    DOB: [birth year], 66 y.o.   MRN: [id number]  Chief Complaint  Patient presents with   Medical Management of Chronic Issues   Pt presents to the office today for  chronic follow up. Her BP is slightly elevated today, but states at home her BP is 136/73.   She has osteopenia. She states she can not take calcium and vit D daily /because it causes constipation. Her last dexa scan was 05/22/21. Arthritis Presents for follow-up visit. She complains of pain and stiffness. The symptoms have been stable. Affected locations include the left knee, right knee, left MCP and right MCP. Her pain is at a severity of 3/10.  Anxiety Presents for follow-up visit. Symptoms include excessive worry, irritability, nervous/anxious behavior and restlessness. Patient reports no depressed mood. Symptoms occur occasionally. The severity of symptoms is mild.    Hyperlipidemia This is a chronic problem. The current episode started more than 1 year ago. The problem is controlled. Current antihyperlipidemic treatment includes statins. The current treatment provides moderate improvement of lipids. Risk factors for coronary artery disease include dyslipidemia, hypertension and a sedentary lifestyle.      Review of Systems  Constitutional:  Positive for irritability.  Musculoskeletal:  Positive for arthritis and stiffness.  Psychiatric/Behavioral:  The patient is nervous/anxious.   All other systems reviewed and are negative.      Objective:   Physical Exam Vitals reviewed.  Constitutional:      General: She is not in acute distress.    Appearance: She is well-developed.  HENT:     Head: Normocephalic and atraumatic.     Right Ear: Tympanic membrane normal.     Left Ear: Tympanic membrane normal.  Eyes:     Pupils: Pupils are equal, round, and reactive to light.  Neck:     Thyroid: No thyromegaly.  Cardiovascular:     Rate and Rhythm: Normal rate and  regular rhythm.     Heart sounds: Normal heart sounds. No murmur heard. Pulmonary:     Effort: Pulmonary effort is normal. No respiratory distress.     Breath sounds: Normal breath sounds. No wheezing.  Abdominal:     General: Bowel sounds are normal. There is no distension.     Palpations: Abdomen is soft.     Tenderness: There is no abdominal tenderness.  Musculoskeletal:        General: No tenderness. Normal range of motion.     Cervical back: Normal range of motion and neck supple.  Skin:    General: Skin is warm and dry.  Neurological:     Mental Status: She is alert and oriented to person, place, and time.     Cranial Nerves: No cranial nerve deficit.     Deep Tendon Reflexes: Reflexes are normal and symmetric.  Psychiatric:        Behavior: Behavior normal.        Thought Content: Thought content normal.        Judgment: Judgment normal.    BP (!) 149/83   Pulse 71   Temp 98.2 F (36.8 C) (Temporal)   Ht 5' (1.524 m)   Wt 165 lb 8 oz (75.1 kg)   SpO2 98%   BMI 32.32 kg/m       Assessment & Plan:  JAKYLA REZA comes in today with chief complaint of Medical Management of Chronic Issues   Diagnosis and orders addressed:  1. Hypertriglyceridemia  -  rosuvastatin (CRESTOR) 5 MG tablet; Take 1 tablet (5 mg total) by mouth every other day.  Dispense: 90 tablet; Refill: 3 - CMP14+EGFR  2. Arthritis of both hands - meloxicam (MOBIC) 7.5 MG tablet; Take 1 tablet (7.5 mg total) by mouth daily.  Dispense: 90 tablet; Refill: 1 - CMP14+EGFR  3. Myalgia  - meloxicam (MOBIC) 7.5 MG tablet; Take 1 tablet (7.5 mg total) by mouth daily.  Dispense: 90 tablet; Refill: 1 - CMP14+EGFR  4. Multiple joint pain - meloxicam (MOBIC) 7.5 MG tablet; Take 1 tablet (7.5 mg total) by mouth daily.  Dispense: 90 tablet; Refill: 1 - CMP14+EGFR  5. Osteopenia, unspecified location - CMP14+EGFR  6. Osteoarthritis of multiple joints, unspecified osteoarthritis type - CMP14+EGFR  7.  Obesity (BMI 30-39.9) - CMP14+EGFR  8. GAD (generalized anxiety disorder) - CMP14+EGFR   Labs pending Health Maintenance reviewed Diet and exercise encouraged  Follow up plan: 6 months    Evelina Dun, FNP

## 2021-12-13 NOTE — Patient Instructions (Signed)
Osteopenia  Osteopenia is a loss of thickness (density) inside the bones. Another name for osteopenia is low bone mass. Mild osteopenia is a normal part of aging. It is not a disease, and it does not cause symptoms. However, if you have osteopenia and continue to lose bone mass, you could develop a condition that causes the bones to become thin and break more easily (osteoporosis). Osteoporosis can cause you to lose some height, have back pain, and have a stooped posture. Although osteopenia is not a disease, making changes to your lifestyle and diet can help to prevent osteopenia from developing into osteoporosis. What are the causes? Osteopenia is caused by loss of calcium in the bones. Bones are constantly changing. Old bone cells are continually being replaced with new bone cells. This process builds new bone. The mineral calcium is needed to build new bone and maintain bone density. Bone density is usually highest around age 35. After that, most people's bodies cannot replace all the bone they have lost with new bone. What increases the risk? You are more likely to develop this condition if: You are older than age 50. You are a woman who went through menopause early. You have a long illness that keeps you in bed. You do not get enough exercise. You lack certain nutrients (malnutrition). You have an overactive thyroid gland (hyperthyroidism). You use products that contain nicotine or tobacco, such as cigarettes, e-cigarettes and chewing tobacco, or you drink a lot of alcohol. You are taking medicines that weaken the bones, such as steroids. What are the signs or symptoms? This condition does not cause any symptoms. You may have a slightly higher risk for bone breaks (fractures), so getting fractures more easily than normal may be an indication of osteopenia. How is this diagnosed? This condition may be diagnosed based on an X-ray exam that measures bone density (dual-energy X-ray  absorptiometry, or DEXA). This test can measure bone density in your hips, spine, and wrists. Osteopenia has no symptoms, so this condition is usually diagnosed after a routine bone density screening test is done for osteoporosis. This routine screening is usually done for: Women who are age 65 or older. Men who are age 70 or older. If you have risk factors for osteopenia, you may have the screening test at an earlier age. How is this treated? Making dietary and lifestyle changes can lower your risk for osteoporosis. If you have severe osteopenia that is close to becoming osteoporosis, this condition can be treated with medicines and dietary supplements such as calcium and vitamin D. These supplements help to rebuild bone density. Follow these instructions at home: Eating and drinking Eat a diet that is high in calcium and vitamin D. Calcium is found in dairy products, beans, salmon, and leafy green vegetables like spinach and broccoli. Look for foods that have vitamin D and calcium added to them (fortified foods), such as orange juice, cereal, and bread.  Lifestyle Do 30 minutes or more of a weight-bearing exercise every day, such as walking, jogging, or playing a sport. These types of exercises strengthen the bones. Do not use any products that contain nicotine or tobacco, such as cigarettes, e-cigarettes, and chewing tobacco. If you need help quitting, ask your health care provider. Do not drink alcohol if: Your health care provider tells you not to drink. You are pregnant, may be pregnant, or are planning to become pregnant. If you drink alcohol: Limit how much you use to: 0-1 drink a day for women. 0-2   drinks a day for men. Be aware of how much alcohol is in your drink. In the [place] one drink equals one 12 oz bottle of beer (355 mL), one 5 oz glass of wine (148 mL), or one 1 oz glass of hard liquor (44 mL). General instructions Take over-the-counter and prescription medicines only as  told by your health care provider. These include vitamins and supplements. Take precautions at home to lower your risk of falling, such as: Keeping rooms well-lit and free of clutter, such as cords. Installing safety rails on stairs. Using rubber mats in the bathroom or other areas that are often wet or slippery. Keep all follow-up visits. This is important. Contact a health care provider if: You have not had a bone density screening for osteoporosis and you are: A woman who is age 65 or older. A man who is age 70 or older. You are a postmenopausal woman who has not had a bone density screening for osteoporosis. You are older than age 50 and you want to know if you should have bone density screening for osteoporosis. Summary Osteopenia is a loss of thickness (density) inside the bones. Another name for osteopenia is low bone mass. Osteopenia is not a disease, but it may increase your risk for a condition that causes the bones to become thin and break more easily (osteoporosis). You may be at risk for osteopenia if you are older than age 50 or if you are a woman who went through early menopause. Osteopenia does not cause any symptoms, but it can be diagnosed with a bone density screening test. Dietary and lifestyle changes are the first treatment for osteopenia. These may lower your risk for osteoporosis. This information is not intended to replace advice given to you by your health care provider. Make sure you discuss any questions you have with your health care provider. Document Revised: 08/19/2019 Document Reviewed: 08/19/2019 Elsevier Patient Education  2023 [employer]  

## 2021-12-14 LAB — CMP14+EGFR
ALT: 18 IU/L (ref 0–32)
AST: 22 IU/L (ref 0–40)
Albumin/Globulin Ratio: 2.2 (ref 1.2–2.2)
Albumin: 4.7 g/dL (ref 3.9–4.9)
Alkaline Phosphatase: 94 IU/L (ref 44–121)
BUN/Creatinine Ratio: 18 (ref 12–28)
BUN: 14 mg/dL (ref 8–27)
Bilirubin Total: 0.2 mg/dL (ref 0.0–1.2)
CO2: 22 mmol/L (ref 20–29)
Calcium: 9.9 mg/dL (ref 8.7–10.3)
Chloride: 106 mmol/L (ref 96–106)
Creatinine, Ser: 0.8 mg/dL (ref 0.57–1.00)
Globulin, Total: 2.1 g/dL (ref 1.5–4.5)
Glucose: 90 mg/dL (ref 70–99)
Potassium: 3.8 mmol/L (ref 3.5–5.2)
Sodium: 144 mmol/L (ref 134–144)
Total Protein: 6.8 g/dL (ref 6.0–8.5)
eGFR: 81 mL/min/{1.73_m2} (ref >59)

## 2022-01-02 ENCOUNTER — Ambulatory Visit (INDEPENDENT_AMBULATORY_CARE_PROVIDER_SITE_OTHER): Payer: Medicare Other

## 2022-01-02 DIAGNOSIS — Z23 Encounter for immunization: Secondary | ICD-10-CM | POA: Diagnosis not present

## 2022-03-08 DIAGNOSIS — Z1231 Encounter for screening mammogram for malignant neoplasm of breast: Secondary | ICD-10-CM | POA: Diagnosis not present

## 2022-03-22 ENCOUNTER — Encounter: Payer: Self-pay | Admitting: Family

## 2022-03-24 DIAGNOSIS — R9431 Abnormal electrocardiogram [ECG] [EKG]: Secondary | ICD-10-CM | POA: Diagnosis not present

## 2022-03-24 DIAGNOSIS — I700 Atherosclerosis of aorta: Secondary | ICD-10-CM | POA: Diagnosis not present

## 2022-03-24 DIAGNOSIS — I2510 Atherosclerotic heart disease of native coronary artery without angina pectoris: Secondary | ICD-10-CM | POA: Diagnosis not present

## 2022-03-24 DIAGNOSIS — E785 Hyperlipidemia, unspecified: Secondary | ICD-10-CM | POA: Diagnosis not present

## 2022-03-24 DIAGNOSIS — R079 Chest pain, unspecified: Secondary | ICD-10-CM | POA: Diagnosis not present

## 2022-03-24 DIAGNOSIS — Z87891 Personal history of nicotine dependence: Secondary | ICD-10-CM | POA: Diagnosis not present

## 2022-03-24 DIAGNOSIS — Z79899 Other long term (current) drug therapy: Secondary | ICD-10-CM | POA: Diagnosis not present

## 2022-04-12 ENCOUNTER — Encounter: Payer: Self-pay | Admitting: Family

## 2022-04-12 ENCOUNTER — Ambulatory Visit (INDEPENDENT_AMBULATORY_CARE_PROVIDER_SITE_OTHER): Payer: Medicare Other | Admitting: Family

## 2022-04-12 VITALS — BP 136/74 | Temp 97.5°F | Ht 60.0 in | Wt 164.0 lb

## 2022-04-12 DIAGNOSIS — K219 Gastro-esophageal reflux disease without esophagitis: Secondary | ICD-10-CM

## 2022-04-12 DIAGNOSIS — Z09 Encounter for follow-up examination after completed treatment for conditions other than malignant neoplasm: Secondary | ICD-10-CM

## 2022-04-12 DIAGNOSIS — M159 Polyosteoarthritis, unspecified: Secondary | ICD-10-CM | POA: Diagnosis not present

## 2022-04-12 DIAGNOSIS — E669 Obesity, unspecified: Secondary | ICD-10-CM

## 2022-04-12 DIAGNOSIS — Z23 Encounter for immunization: Secondary | ICD-10-CM

## 2022-04-12 DIAGNOSIS — Z6832 Body mass index (BMI) 32.0-32.9, adult: Secondary | ICD-10-CM

## 2022-04-12 MED ORDER — PANTOPRAZOLE SODIUM 20 MG PO TBEC: 40.0000 mg | DELAYED_RELEASE_TABLET | Freq: Every day | ORAL | 3 refills | Status: DC

## 2022-04-12 NOTE — Addendum Note (Signed)
Addended by: Ladean Raya on: 04/12/2022 10:44 AM   Modules accepted: Orders

## 2022-04-12 NOTE — Progress Notes (Signed)
Subjective:    Patient ID: Alison White, female    DOB: 1955/06/11, 67 y.o.   MRN: [id number]  Chief Complaint  Patient presents with   Hospitalization Follow-up   PT presents to the office today for ED follow up. She was seen on 03/24/22 with chest pain. She had a negative CT chest for acute finding, negative troponin, and EKG. She was diagnosed with GERD and started on Protonix 20 mg BID and reports she is feeling much better since starting that.  Gastroesophageal Reflux She complains of belching, chest pain, dysphagia and heartburn. This is a chronic problem. The current episode started more than 1 year ago. The problem occurs occasionally. Risk factors include obesity. She has tried a PPI for the symptoms. The treatment provided moderate relief.  Arthritis Presents for follow-up visit. She complains of pain and stiffness. Affected locations include the left knee, right knee, right MCP and left MCP. Her pain is at a severity of 6/10.      Review of Systems  Cardiovascular:  Positive for chest pain.  Gastrointestinal:  Positive for dysphagia and heartburn.  Musculoskeletal:  Positive for arthritis and stiffness.  All other systems reviewed and are negative.      Objective:   Physical Exam Vitals reviewed.  Constitutional:      General: She is not in acute distress.    Appearance: She is well-developed. She is obese.  HENT:     Head: Normocephalic and atraumatic.  Eyes:     Pupils: Pupils are equal, round, and reactive to light.  Neck:     Thyroid: No thyromegaly.  Cardiovascular:     Rate and Rhythm: Normal rate and regular rhythm.     Heart sounds: Normal heart sounds. No murmur heard. Pulmonary:     Effort: Pulmonary effort is normal. No respiratory distress.     Breath sounds: Normal breath sounds. No wheezing.  Abdominal:     General: Bowel sounds are normal. There is no distension.     Palpations: Abdomen is soft.     Tenderness: There is no abdominal  tenderness.  Musculoskeletal:        General: No tenderness. Normal range of motion.     Cervical back: Normal range of motion and neck supple.  Skin:    General: Skin is warm and dry.  Neurological:     Mental Status: She is alert and oriented to person, place, and time.     Cranial Nerves: No cranial nerve deficit.     Deep Tendon Reflexes: Reflexes are normal and symmetric.  Psychiatric:        Behavior: Behavior normal.        Thought Content: Thought content normal.        Judgment: Judgment normal.     BP (!) 154/89   Temp (!) 97.5 F (36.4 C) (Temporal)   Ht 5' (1.524 m)   Wt 164 lb (74.4 kg)   SpO2 95%   BMI 32.03 kg/m        Assessment & Plan:  Alison White comes in today with chief complaint of Hospitalization Follow-up   Diagnosis and orders addressed:  1. Gastroesophageal reflux disease without esophagitis Continue Protonix 20 mg BID -Diet discussed- Avoid fried, spicy, citrus foods, caffeine and alcohol -Do not eat 2-3 hours before bedtime -Encouraged small frequent meals -Limit NSAID's - pantoprazole (PROTONIX) 20 MG tablet; Take 2 tablets (40 mg total) by mouth daily.  Dispense: 180 tablet; Refill: 3  2.  Hospital discharge follow-up Notes reviewed  3. Osteoarthritis of multiple joints, unspecified osteoarthritis type Hold Mobic and try tylenol. Can take mobic as needed  4. Obesity (BMI 30-39.9)    Health Maintenance reviewed Diet and exercise encouraged  Follow up plan: Keep chronic follow up   Alison Dun, FNP

## 2022-04-12 NOTE — Patient Instructions (Signed)
Gastroesophageal Reflux Disease, Adult Gastroesophageal reflux (GER) happens when acid from the stomach flows up into the tube that connects the mouth and the stomach (esophagus). Normally, food travels down the esophagus and stays in the stomach to be digested. However, when a person has GER, food and stomach acid sometimes move back up into the esophagus. If this becomes a more serious problem, the person may be diagnosed with a disease called gastroesophageal reflux disease (GERD). GERD occurs when the reflux: Happens often. Causes frequent or severe symptoms. Causes problems such as damage to the esophagus. When stomach acid comes in contact with the esophagus, the acid may cause inflammation in the esophagus. Over time, GERD may create small holes (ulcers) in the lining of the esophagus. What are the causes? This condition is caused by a problem with the muscle between the esophagus and the stomach (lower esophageal sphincter, or LES). Normally, the LES muscle closes after food passes through the esophagus to the stomach. When the LES is weakened or abnormal, it does not close properly, and that allows food and stomach acid to go back up into the esophagus. The LES can be weakened by certain dietary substances, medicines, and medical conditions, including: Tobacco use. Pregnancy. Having a hiatal hernia. Alcohol use. Certain foods and beverages, such as coffee, chocolate, onions, and peppermint. What increases the risk? You are more likely to develop this condition if you: Have an increased body weight. Have a connective tissue disorder. Take NSAIDs, such as ibuprofen. What are the signs or symptoms? Symptoms of this condition include: Heartburn. Difficult or painful swallowing and the feeling of having a lump in the throat. A bitter taste in the mouth. Bad breath and having a large amount of saliva. Having an upset or bloated stomach and belching. Chest pain. Different conditions can  cause chest pain. Make sure you see your health care provider if you experience chest pain. Shortness of breath or wheezing. Ongoing (chronic) cough or a nighttime cough. Wearing away of tooth enamel. Weight loss. How is this diagnosed? This condition may be diagnosed based on a medical history and a physical exam. To determine if you have mild or severe GERD, your health care provider may also monitor how you respond to treatment. You may also have tests, including: A test to examine your stomach and esophagus with a small camera (endoscopy). A test that measures the acidity level in your esophagus. A test that measures how much pressure is on your esophagus. A barium swallow or modified barium swallow test to show the shape, size, and functioning of your esophagus. How is this treated? Treatment for this condition may vary depending on how severe your symptoms are. Your health care provider may recommend: Changes to your diet. Medicine. Surgery. The goal of treatment is to help relieve your symptoms and to prevent complications. Follow these instructions at home: Eating and drinking  Follow a diet as recommended by your health care provider. This may involve avoiding foods and drinks such as: Coffee and tea, with or without caffeine. Drinks that contain alcohol. Energy drinks and sports drinks. Carbonated drinks or sodas. Chocolate and cocoa. Peppermint and mint flavorings. Garlic and onions. Horseradish. Spicy and acidic foods, including peppers, chili powder, curry powder, vinegar, hot sauces, and barbecue sauce. Citrus fruit juices and citrus fruits, such as oranges, lemons, and limes. Tomato-based foods, such as red sauce, chili, salsa, and pizza with red sauce. Fried and fatty foods, such as donuts, french fries, potato chips, and high-fat dressings.   High-fat meats, such as hot dogs and fatty cuts of red and white meats, such as rib eye steak, sausage, ham, and  bacon. High-fat dairy items, such as whole milk, butter, and cream cheese. Eat small, frequent meals instead of large meals. Avoid drinking large amounts of liquid with your meals. Avoid eating meals during the 2-3 hours before bedtime. Avoid lying down right after you eat. Do not exercise right after you eat. Lifestyle  Do not use any products that contain nicotine or tobacco. These products include cigarettes, chewing tobacco, and vaping devices, such as e-cigarettes. If you need help quitting, ask your health care provider. Try to reduce your stress by using methods such as yoga or meditation. If you need help reducing stress, ask your health care provider. If you are overweight, reduce your weight to an amount that is healthy for you. Ask your health care provider for guidance about a safe weight loss goal. General instructions Pay attention to any changes in your symptoms. Take over-the-counter and prescription medicines only as told by your health care provider. Do not take aspirin, ibuprofen, or other NSAIDs unless your health care provider told you to take these medicines. Wear loose-fitting clothing. Do not wear anything tight around your waist that causes pressure on your abdomen. Raise (elevate) the head of your bed about 6 inches (15 cm). You can use a wedge to do this. Avoid bending over if this makes your symptoms worse. Keep all follow-up visits. This is important. Contact a health care provider if: You have: New symptoms. Unexplained weight loss. Difficulty swallowing or it hurts to swallow. Wheezing or a persistent cough. A hoarse voice. Your symptoms do not improve with treatment. Get help right away if: You have sudden pain in your arms, neck, jaw, teeth, or back. You suddenly feel sweaty, dizzy, or light-headed. You have chest pain or shortness of breath. You vomit and the vomit is green, yellow, or black, or it looks like blood or coffee grounds. You faint. You  have stool that is red, bloody, or black. You cannot swallow, drink, or eat. These symptoms may represent a serious problem that is an emergency. Do not wait to see if the symptoms will go away. Get medical help right away. Call your local emergency services (911 in the [place] Do not drive yourself to the hospital. Summary Gastroesophageal reflux happens when acid from the stomach flows up into the esophagus. GERD is a disease in which the reflux happens often, causes frequent or severe symptoms, or causes problems such as damage to the esophagus. Treatment for this condition may vary depending on how severe your symptoms are. Your health care provider may recommend diet and lifestyle changes, medicine, or surgery. Contact a health care provider if you have new or worsening symptoms. Take over-the-counter and prescription medicines only as told by your health care provider. Do not take aspirin, ibuprofen, or other NSAIDs unless your health care provider told you to do so. Keep all follow-up visits as told by your health care provider. This is important. This information is not intended to replace advice given to you by your health care provider. Make sure you discuss any questions you have with your health care provider. Document Revised: 09/13/2019 Document Reviewed: 09/13/2019 Elsevier Patient Education  2023 [employer] Inc[employer]  

## 2022-05-21 ENCOUNTER — Telehealth: Payer: Self-pay | Admitting: Family

## 2022-06-13 ENCOUNTER — Ambulatory Visit (INDEPENDENT_AMBULATORY_CARE_PROVIDER_SITE_OTHER): Payer: Medicare Other | Admitting: Family

## 2022-06-13 ENCOUNTER — Encounter: Payer: Self-pay | Admitting: Family

## 2022-06-13 VITALS — BP 139/76 | HR 76 | Temp 97.8°F | Ht 60.0 in | Wt 159.0 lb

## 2022-06-13 DIAGNOSIS — K219 Gastro-esophageal reflux disease without esophagitis: Secondary | ICD-10-CM | POA: Insufficient documentation

## 2022-06-13 DIAGNOSIS — M8580 Other specified disorders of bone density and structure, unspecified site: Secondary | ICD-10-CM

## 2022-06-13 DIAGNOSIS — M19041 Primary osteoarthritis, right hand: Secondary | ICD-10-CM

## 2022-06-13 DIAGNOSIS — R739 Hyperglycemia, unspecified: Secondary | ICD-10-CM | POA: Diagnosis not present

## 2022-06-13 DIAGNOSIS — Z0001 Encounter for general adult medical examination with abnormal findings: Secondary | ICD-10-CM | POA: Diagnosis not present

## 2022-06-13 DIAGNOSIS — M791 Myalgia, unspecified site: Secondary | ICD-10-CM | POA: Diagnosis not present

## 2022-06-13 DIAGNOSIS — E781 Pure hyperglyceridemia: Secondary | ICD-10-CM | POA: Diagnosis not present

## 2022-06-13 DIAGNOSIS — M159 Polyosteoarthritis, unspecified: Secondary | ICD-10-CM

## 2022-06-13 DIAGNOSIS — F411 Generalized anxiety disorder: Secondary | ICD-10-CM

## 2022-06-13 DIAGNOSIS — Z0000 Encounter for general adult medical examination without abnormal findings: Secondary | ICD-10-CM | POA: Diagnosis not present

## 2022-06-13 DIAGNOSIS — M19042 Primary osteoarthritis, left hand: Secondary | ICD-10-CM | POA: Diagnosis not present

## 2022-06-13 DIAGNOSIS — E669 Obesity, unspecified: Secondary | ICD-10-CM

## 2022-06-13 MED ORDER — MELOXICAM 7.5 MG PO TABS: 7.5000 mg | ORAL_TABLET | Freq: Every day | ORAL | 1 refills | Status: DC

## 2022-06-13 MED ORDER — BUSPIRONE HCL 7.5 MG PO TABS: 7.5000 mg | ORAL_TABLET | Freq: Three times a day (TID) | ORAL | 6 refills | Status: DC | PRN

## 2022-06-13 NOTE — Patient Instructions (Signed)
Health Maintenance After Age 67 After age 67, you are at a higher risk for certain long-term diseases and infections as well as injuries from falls. Falls are a major cause of broken bones and head injuries in people who are older than age 67. Getting regular preventive care can help to keep you healthy and well. Preventive care includes getting regular testing and making lifestyle changes as recommended by your health care provider. Talk with your health care provider about: Which screenings and tests you should have. A screening is a test that checks for a disease when you have no symptoms. A diet and exercise plan that is right for you. What should I know about screenings and tests to prevent falls? Screening and testing are the best ways to find a health problem early. Early diagnosis and treatment give you the best chance of managing medical conditions that are common after age 67. Certain conditions and lifestyle choices may make you more likely to have a fall. Your health care provider may recommend: Regular vision checks. Poor vision and conditions such as cataracts can make you more likely to have a fall. If you wear glasses, make sure to get your prescription updated if your vision changes. Medicine review. Work with your health care provider to regularly review all of the medicines you are taking, including over-the-counter medicines. Ask your health care provider about any side effects that may make you more likely to have a fall. Tell your health care provider if any medicines that you take make you feel dizzy or sleepy. Strength and balance checks. Your health care provider may recommend certain tests to check your strength and balance while standing, walking, or changing positions. Foot health exam. Foot pain and numbness, as well as not wearing proper footwear, can make you more likely to have a fall. Screenings, including: Osteoporosis screening. Osteoporosis is a condition that causes  the bones to get weaker and break more easily. Blood pressure screening. Blood pressure changes and medicines to control blood pressure can make you feel dizzy. Depression screening. You may be more likely to have a fall if you have a fear of falling, feel depressed, or feel unable to do activities that you used to do. Alcohol use screening. Using too much alcohol can affect your balance and may make you more likely to have a fall. Follow these instructions at home: Lifestyle Do not drink alcohol if: Your health care provider tells you not to drink. If you drink alcohol: Limit how much you have to: 0-1 drink a day for women. 0-2 drinks a day for men. Know how much alcohol is in your drink. In the [place] one drink equals one 12 oz bottle of beer (355 mL), one 5 oz glass of wine (148 mL), or one 1 oz glass of hard liquor (44 mL). Do not use any products that contain nicotine or tobacco. These products include cigarettes, chewing tobacco, and vaping devices, such as e-cigarettes. If you need help quitting, ask your health care provider. Activity  Follow a regular exercise program to stay fit. This will help you maintain your balance. Ask your health care provider what types of exercise are appropriate for you. If you need a cane or walker, use it as recommended by your health care provider. Wear supportive shoes that have nonskid soles. Safety  Remove any tripping hazards, such as rugs, cords, and clutter. Install safety equipment such as grab bars in bathrooms and safety rails on stairs. Keep rooms and walkways   well-lit. General instructions Talk with your health care provider about your risks for falling. Tell your health care provider if: You fall. Be sure to tell your health care provider about all falls, even ones that seem minor. You feel dizzy, tiredness (fatigue), or off-balance. Take over-the-counter and prescription medicines only as told by your health care provider. These include  supplements. Eat a healthy diet and maintain a healthy weight. A healthy diet includes low-fat dairy products, low-fat (lean) meats, and fiber from whole grains, beans, and lots of fruits and vegetables. Stay current with your vaccines. Schedule regular health, dental, and eye exams. Summary Having a healthy lifestyle and getting preventive care can help to protect your health and wellness after age 67. Screening and testing are the best way to find a health problem early and help you avoid having a fall. Early diagnosis and treatment give you the best chance for managing medical conditions that are more common for people who are older than age 67. Falls are a major cause of broken bones and head injuries in people who are older than age 67. Take precautions to prevent a fall at home. Work with your health care provider to learn what changes you can make to improve your health and wellness and to prevent falls. This information is not intended to replace advice given to you by your health care provider. Make sure you discuss any questions you have with your health care provider. Document Revised: 07/24/2020 Document Reviewed: 07/24/2020 Elsevier Patient Education  2023 [employer] Inc[employer]  

## 2022-06-13 NOTE — Progress Notes (Signed)
Subjective:    Patient ID: Alison White, female    DOB: 1956/02/06, 67 y.o.   MRN: [id number]  Chief Complaint  Patient presents with   Medical Management of Chronic Issues   Pt presents to the office today for  CPE and chronic follow up. Her BP is slightly elevated today, but states at home her BP is 139/76.    She has osteopenia. She states she can not take calcium and vit D daily /because it causes constipation. Her last dexa scan was 05/22/21. Arthritis Presents for follow-up visit. She complains of pain and stiffness. The symptoms have been stable. Affected locations include the right knee, left knee, left MCP and right MCP. Her pain is at a severity of 6/10.  Hyperlipidemia This is a chronic problem. The current episode started more than 1 year ago. The problem is uncontrolled. Current antihyperlipidemic treatment includes statins. The current treatment provides moderate improvement of lipids. Risk factors for coronary artery disease include dyslipidemia, a sedentary lifestyle and post-menopausal.  Anxiety Presents for follow-up visit. Symptoms include excessive worry, nervous/anxious behavior and restlessness. Symptoms occur most days. The severity of symptoms is moderate.    Gastroesophageal Reflux She complains of belching and heartburn. This is a chronic problem. The current episode started more than 1 year ago. The problem occurs occasionally. She has tried a PPI for the symptoms. The treatment provided moderate relief.      Review of Systems  Gastrointestinal:  Positive for heartburn.  Musculoskeletal:  Positive for arthritis and stiffness.  Psychiatric/Behavioral:  The patient is nervous/anxious.   All other systems reviewed and are negative.  Family History  Problem Relation Age of Onset   Alzheimer's disease Mother    Cancer Father        prostate   Social History   Socioeconomic History   Marital status: Widowed    Spouse name: Not on file   Number of  children: Not on file   Years of education: Not on file   Highest education level: Not on file  Occupational History   Not on file  Tobacco Use   Smoking status: Former    Types: Cigarettes    Quit date: 03/18/1997    Years since quitting: 25.2   Smokeless tobacco: Never  Vaping Use   Vaping Use: Never used  Substance and Sexual Activity   Alcohol use: Yes    Comment: Drinks 4 beer daily.   Drug use: No   Sexual activity: Not on file  Other Topics Concern   Not on file  Social History Narrative   Not on file   Social Determinants of Health   Financial Resource Strain: Not on file  Food Insecurity: Not on file  Transportation Needs: Not on file  Physical Activity: Not on file  Stress: Not on file  Social Connections: Not on file       Objective:   Physical Exam Vitals reviewed.  Constitutional:      General: She is not in acute distress.    Appearance: She is well-developed.  HENT:     Head: Normocephalic and atraumatic.     Right Ear: Tympanic membrane normal.     Left Ear: Tympanic membrane normal.  Eyes:     Pupils: Pupils are equal, round, and reactive to light.  Neck:     Thyroid: No thyromegaly.  Cardiovascular:     Rate and Rhythm: Normal rate and regular rhythm.     Heart sounds: Normal heart sounds.  No murmur heard. Pulmonary:     Effort: Pulmonary effort is normal. No respiratory distress.     Breath sounds: Normal breath sounds. No wheezing.  Abdominal:     General: Bowel sounds are normal. There is no distension.     Palpations: Abdomen is soft.     Tenderness: There is no abdominal tenderness.  Musculoskeletal:        General: No tenderness. Normal range of motion.     Cervical back: Normal range of motion and neck supple.  Skin:    General: Skin is warm and dry.  Neurological:     Mental Status: She is alert and oriented to person, place, and time.     Cranial Nerves: No cranial nerve deficit.     Deep Tendon Reflexes: Reflexes are normal  and symmetric.  Psychiatric:        Behavior: Behavior normal.        Thought Content: Thought content normal.        Judgment: Judgment normal.       BP (!) 147/83   Pulse 76   Temp 97.8 F (36.6 C) (Temporal)   Ht 5' (1.524 m)   Wt 159 lb (72.1 kg)   SpO2 95%   BMI 31.05 kg/m      Assessment & Plan:  Alison White comes in today with chief complaint of Medical Management of Chronic Issues   Diagnosis and orders addressed:  1. GAD (generalized anxiety disorder) - busPIRone (BUSPAR) 7.5 MG tablet; Take 1 tablet (7.5 mg total) by mouth 3 (three) times daily as needed.  Dispense: 90 tablet; Refill: 6 - CMP14+EGFR - CBC with Differential/Platelet  2. Myalgia - meloxicam (MOBIC) 7.5 MG tablet; Take 1 tablet (7.5 mg total) by mouth daily.  Dispense: 90 tablet; Refill: 1 - CMP14+EGFR - CBC with Differential/Platelet  3. Arthritis of both hands  - meloxicam (MOBIC) 7.5 MG tablet; Take 1 tablet (7.5 mg total) by mouth daily.  Dispense: 90 tablet; Refill: 1 - CMP14+EGFR - CBC with Differential/Platelet  4. Obesity (BMI 30-39.9) - CMP14+EGFR - CBC with Differential/Platelet  5. Hypertriglyceridemia - CMP14+EGFR - CBC with Differential/Platelet  6. Osteoarthritis of multiple joints, unspecified osteoarthritis type  - CMP14+EGFR - CBC with Differential/Platelet  7. Osteopenia, unspecified location - CMP14+EGFR - CBC with Differential/Platelet  8. Gastroesophageal reflux disease, unspecified whether esophagitis present  - CMP14+EGFR - CBC with Differential/Platelet  9. Annual physical exam - CMP14+EGFR - CBC with Differential/Platelet - Lipid panel - TSH   Labs pending Health Maintenance reviewed Diet and exercise encouraged  Follow up plan: 6 months    Evelina Dun, FNP

## 2022-06-14 LAB — CMP14+EGFR
ALT: 22 IU/L (ref 0–32)
AST: 25 IU/L (ref 0–40)
Albumin/Globulin Ratio: 2 (ref 1.2–2.2)
Albumin: 4.7 g/dL (ref 3.9–4.9)
Alkaline Phosphatase: 115 IU/L (ref 44–121)
BUN/Creatinine Ratio: 13 (ref 12–28)
BUN: 10 mg/dL (ref 8–27)
Bilirubin Total: 0.3 mg/dL (ref 0.0–1.2)
CO2: 23 mmol/L (ref 20–29)
Calcium: 9.5 mg/dL (ref 8.7–10.3)
Chloride: 105 mmol/L (ref 96–106)
Creatinine, Ser: 0.76 mg/dL (ref 0.57–1.00)
Globulin, Total: 2.3 g/dL (ref 1.5–4.5)
Glucose: 133 mg/dL — ABNORMAL HIGH (ref 70–99)
Potassium: 4 mmol/L (ref 3.5–5.2)
Sodium: 143 mmol/L (ref 134–144)
Total Protein: 7 g/dL (ref 6.0–8.5)
eGFR: 86 mL/min/{1.73_m2} (ref >59)

## 2022-06-14 LAB — CBC WITH DIFFERENTIAL/PLATELET
Basophils Absolute: 0 10*3/uL (ref 0.0–0.2)
Basos: 0 %
EOS (ABSOLUTE): 0.1 10*3/uL (ref 0.0–0.4)
Eos: 1 %
Hematocrit: 40.3 % (ref 34.0–46.6)
Hemoglobin: 13.3 g/dL (ref 11.1–15.9)
Immature Grans (Abs): 0 10*3/uL (ref 0.0–0.1)
Immature Granulocytes: 0 %
Lymphocytes Absolute: 1.4 10*3/uL (ref 0.7–3.1)
Lymphs: 19 %
MCH: 32.3 pg (ref 26.6–33.0)
MCHC: 33 g/dL (ref 31.5–35.7)
MCV: 98 fL — ABNORMAL HIGH (ref 79–97)
Monocytes Absolute: 0.6 10*3/uL (ref 0.1–0.9)
Monocytes: 8 %
Neutrophils Absolute: 5.2 10*3/uL (ref 1.4–7.0)
Neutrophils: 72 %
Platelets: 276 10*3/uL (ref 150–450)
RBC: 4.12 x10E6/uL (ref 3.77–5.28)
RDW: 12.3 % (ref 11.7–15.4)
WBC: 7.3 10*3/uL (ref 3.4–10.8)

## 2022-06-14 LAB — LIPID PANEL
Chol/HDL Ratio: 4.2 ratio (ref 0.0–4.4)
Cholesterol, Total: 210 mg/dL — ABNORMAL HIGH (ref 100–199)
HDL: 50 mg/dL (ref >39)
LDL Chol Calc (NIH): 122 mg/dL — ABNORMAL HIGH (ref 0–99)
Triglycerides: 218 mg/dL — ABNORMAL HIGH (ref 0–149)
VLDL Cholesterol Cal: 38 mg/dL (ref 5–40)

## 2022-06-14 LAB — TSH: TSH: 1.4 u[IU]/mL (ref 0.450–4.500)

## 2022-06-17 ENCOUNTER — Other Ambulatory Visit: Payer: Self-pay | Admitting: Family

## 2022-06-17 MED ORDER — ROSUVASTATIN CALCIUM 10 MG PO TABS: 10.0000 mg | ORAL_TABLET | Freq: Every day | ORAL | 3 refills | Status: DC

## 2022-06-17 MED ORDER — ROSUVASTATIN CALCIUM 5 MG PO TABS: 5.0000 mg | ORAL_TABLET | Freq: Every day | ORAL | 3 refills | Status: DC

## 2022-06-17 NOTE — Telephone Encounter (Signed)
Ok, she can try Crestor 5 mg daily. If has leg cramps takes every other day.

## 2022-06-17 NOTE — Telephone Encounter (Signed)
Has been taking 5mg  qod. When she takes everyday she has leg pain. Pt wants to know if instead of increasing to 10mg  daily if she can just start back on 5mg  everyday and see how she does.

## 2022-06-17 NOTE — Telephone Encounter (Signed)
Patient aware and verbalized understanding. °

## 2022-06-18 LAB — SPECIMEN STATUS REPORT

## 2022-06-18 LAB — HGB A1C W/O EAG: Hgb A1c MFr Bld: 5.4 % (ref 4.8–5.6)

## 2022-07-12 ENCOUNTER — Encounter: Payer: Self-pay | Admitting: Family Medicine

## 2022-07-12 ENCOUNTER — Ambulatory Visit (INDEPENDENT_AMBULATORY_CARE_PROVIDER_SITE_OTHER): Payer: Medicare Other | Admitting: Family Medicine

## 2022-07-12 VITALS — BP 180/84 | HR 73 | Temp 98.3°F | Ht 61.0 in | Wt 156.4 lb

## 2022-07-12 DIAGNOSIS — N3000 Acute cystitis without hematuria: Secondary | ICD-10-CM

## 2022-07-12 DIAGNOSIS — R3989 Other symptoms and signs involving the genitourinary system: Secondary | ICD-10-CM | POA: Diagnosis not present

## 2022-07-12 LAB — MICROSCOPIC EXAMINATION
RBC, Urine: NONE SEEN /hpf (ref 0–2)
Renal Epithel, UA: NONE SEEN /hpf

## 2022-07-12 LAB — URINALYSIS, ROUTINE W REFLEX MICROSCOPIC
Bilirubin, UA: NEGATIVE
Glucose, UA: NEGATIVE
Ketones, UA: NEGATIVE
Nitrite, UA: NEGATIVE
Protein,UA: NEGATIVE
RBC, UA: NEGATIVE
Specific Gravity, UA: 1.01 (ref 1.005–1.030)
Urobilinogen, Ur: 0.2 mg/dL (ref 0.2–1.0)
pH, UA: 6 (ref 5.0–7.5)

## 2022-07-12 MED ORDER — CEPHALEXIN 500 MG PO CAPS
500.0000 mg | ORAL_CAPSULE | Freq: Two times a day (BID) | ORAL | 0 refills | Status: AC
Start: 2022-07-12 — End: ?

## 2022-07-12 NOTE — Progress Notes (Signed)
Subjective:  Patient ID: Alison White, female    DOB: 1955-12-29, 67 y.o.   MRN: [id number]  Patient Care Team: Junie Spencer, FNP as PCP - General (Family Medicine)   Chief Complaint:  bladder pain (X 4 days )   HPI: Alison White is a 67 y.o. female presenting on 07/12/2022 for bladder pain (X 4 days )   Dysuria  This is a new problem. Episode onset: Tuesday. The problem occurs every urination. The problem has been unchanged. The quality of the pain is described as aching and burning. The pain is mild. There has been no fever. Pertinent negatives include no chills, discharge, flank pain, frequency, hematuria, hesitancy, nausea, possible pregnancy, sweats, urgency or vomiting. She has tried increased fluids for the symptoms. The treatment provided no relief.     Relevant past medical, surgical, family, and social history reviewed and updated as indicated.  Allergies and medications reviewed and updated. Data reviewed: Chart in Epic.   Past Medical History:  Diagnosis Date   Anxiety     Past Surgical History:  Procedure Laterality Date   ABDOMINAL HYSTERECTOMY     APPENDECTOMY     CHOLECYSTECTOMY     KNEE CARTILAGE SURGERY Left     Social History   Socioeconomic History   Marital status: Widowed    Spouse name: Not on file   Number of children: Not on file   Years of education: Not on file   Highest education level: Not on file  Occupational History   Not on file  Tobacco Use   Smoking status: Former    Types: Cigarettes    Quit date: 03/18/1997    Years since quitting: 25.3   Smokeless tobacco: Never  Vaping Use   Vaping Use: Never used  Substance and Sexual Activity   Alcohol use: Yes    Comment: Drinks 4 beer daily.   Drug use: No   Sexual activity: Not on file  Other Topics Concern   Not on file  Social History Narrative   Not on file   Social Determinants of Health   Financial Resource Strain: Not on file  Food Insecurity: Not on file   Transportation Needs: Not on file  Physical Activity: Not on file  Stress: Not on file  Social Connections: Not on file  Intimate Partner Violence: Not on file    Outpatient Encounter Medications as of 07/12/2022  Medication Sig   busPIRone (BUSPAR) 7.5 MG tablet Take 1 tablet (7.5 mg total) by mouth 3 (three) times daily as needed.   cephALEXin (KEFLEX) 500 MG capsule Take 1 capsule (500 mg total) by mouth 2 (two) times daily.   meloxicam (MOBIC) 7.5 MG tablet Take 1 tablet (7.5 mg total) by mouth daily.   pantoprazole (PROTONIX) 20 MG tablet Take 2 tablets (40 mg total) by mouth daily.   rosuvastatin (CRESTOR) 5 MG tablet Take 1 tablet (5 mg total) by mouth daily.   No facility-administered encounter medications on file as of 07/12/2022.    Allergies  Allergen Reactions   Lexapro [Escitalopram Oxalate]     Felt chest pain, nausea, shaking with  rubber legs.    Review of Systems  Constitutional:  Negative for activity change, appetite change, chills, diaphoresis, fatigue, fever and unexpected weight change.  HENT: Negative.    Eyes: Negative.   Respiratory:  Negative for cough, chest tightness and shortness of breath.   Cardiovascular:  Negative for chest pain, palpitations and leg swelling.  Gastrointestinal:  Positive for abdominal pain (lower abdominal pressure). Negative for blood in stool, constipation, diarrhea, nausea and vomiting.  Endocrine: Negative.   Genitourinary:  Positive for dysuria. Negative for decreased urine volume, difficulty urinating, flank pain, frequency, hematuria, hesitancy and urgency.  Musculoskeletal:  Negative for arthralgias and myalgias.  Skin: Negative.   Allergic/Immunologic: Negative.   Neurological:  Negative for dizziness, weakness and headaches.  Hematological: Negative.   Psychiatric/Behavioral:  Negative for confusion, hallucinations, sleep disturbance and suicidal ideas.   All other systems reviewed and are negative.        Objective:  BP (!) 180/84   Pulse 73   Temp 98.3 F (36.8 C) (Temporal)   Ht 5\' 1"  (1.549 m)   Wt 156 lb 6.4 oz (70.9 kg)   SpO2 96%   BMI 29.55 kg/m    Wt Readings from Last 3 Encounters:  07/12/22 156 lb 6.4 oz (70.9 kg)  06/13/22 159 lb (72.1 kg)  04/12/22 164 lb (74.4 kg)    Physical Exam Vitals and nursing note reviewed.  Constitutional:      General: She is not in acute distress.    Appearance: Normal appearance. She is not ill-appearing, toxic-appearing or diaphoretic.  HENT:     Head: Normocephalic and atraumatic.     Mouth/Throat:     Mouth: Mucous membranes are moist.  Eyes:     Pupils: Pupils are equal, round, and reactive to light.  Cardiovascular:     Rate and Rhythm: Normal rate and regular rhythm.     Heart sounds: Normal heart sounds.  Pulmonary:     Effort: Pulmonary effort is normal.     Breath sounds: Normal breath sounds.  Abdominal:     General: Bowel sounds are normal.     Palpations: Abdomen is soft.     Tenderness: There is no abdominal tenderness. There is no right CVA tenderness or left CVA tenderness.  Skin:    General: Skin is warm and dry.     Capillary Refill: Capillary refill takes less than 2 seconds.  Neurological:     General: No focal deficit present.     Mental Status: She is alert and oriented to person, place, and time.  Psychiatric:        Mood and Affect: Mood normal.        Behavior: Behavior normal.        Thought Content: Thought content normal.        Judgment: Judgment normal.     Results for orders placed or performed in visit on 06/13/22  CMP14+EGFR  Result Value Ref Range   Glucose 133 (H) 70 - 99 mg/dL   BUN 10 8 - 27 mg/dL   Creatinine, Ser 7.82 0.57 - 1.00 mg/dL   eGFR 86 >95 AO/ZHY/8.65   BUN/Creatinine Ratio 13 12 - 28   Sodium 143 134 - 144 mmol/L   Potassium 4.0 3.5 - 5.2 mmol/L   Chloride 105 96 - 106 mmol/L   CO2 23 20 - 29 mmol/L   Calcium 9.5 8.7 - 10.3 mg/dL   Total Protein 7.0 6.0 - 8.5  g/dL   Albumin 4.7 3.9 - 4.9 g/dL   Globulin, Total 2.3 1.5 - 4.5 g/dL   Albumin/Globulin Ratio 2.0 1.2 - 2.2   Bilirubin Total 0.3 0.0 - 1.2 mg/dL   Alkaline Phosphatase 115 44 - 121 IU/L   AST 25 0 - 40 IU/L   ALT 22 0 - 32 IU/L  CBC with Differential/Platelet  Result Value Ref Range   WBC 7.3 3.4 - 10.8 x10E3/uL   RBC 4.12 3.77 - 5.28 x10E6/uL   Hemoglobin 13.3 11.1 - 15.9 g/dL   Hematocrit 40.9 81.1 - 46.6 %   MCV 98 (H) 79 - 97 fL   MCH 32.3 26.6 - 33.0 pg   MCHC 33.0 31.5 - 35.7 g/dL   RDW 91.4 78.2 - 95.6 %   Platelets 276 150 - 450 x10E3/uL   Neutrophils 72 Not Estab. %   Lymphs 19 Not Estab. %   Monocytes 8 Not Estab. %   Eos 1 Not Estab. %   Basos 0 Not Estab. %   Neutrophils Absolute 5.2 1.4 - 7.0 x10E3/uL   Lymphocytes Absolute 1.4 0.7 - 3.1 x10E3/uL   Monocytes Absolute 0.6 0.1 - 0.9 x10E3/uL   EOS (ABSOLUTE) 0.1 0.0 - 0.4 x10E3/uL   Basophils Absolute 0.0 0.0 - 0.2 x10E3/uL   Immature Granulocytes 0 Not Estab. %   Immature Grans (Abs) 0.0 0.0 - 0.1 x10E3/uL  Lipid panel  Result Value Ref Range   Cholesterol, Total 210 (H) 100 - 199 mg/dL   Triglycerides 213 (H) 0 - 149 mg/dL   HDL 50 >08 mg/dL   VLDL Cholesterol Cal 38 5 - 40 mg/dL   LDL Chol Calc (NIH) 657 (H) 0 - 99 mg/dL   Chol/HDL Ratio 4.2 0.0 - 4.4 ratio  TSH  Result Value Ref Range   TSH 1.400 0.450 - 4.500 uIU/mL  Hgb A1c w/o eAG  Result Value Ref Range   Hgb A1c MFr Bld 5.4 4.8 - 5.6 %  Specimen status report  Result Value Ref Range   specimen status report Comment        Pertinent labs & imaging results that were available during my care of the patient were reviewed by me and considered in my medical decision making.  Assessment & Plan:  Caterina was seen today for bladder pain.  Diagnoses and all orders for this visit:  Bladder pain Urinalysis with bacteria, 1+ leukocytes.  -     Urinalysis, Routine w reflex microscopic  Acute cystitis without hematuria Will initiate below. Culture  pending, will change regimen if warranted. Report new, worsening, or persistent symptoms. -     cephALEXin (KEFLEX) 500 MG capsule; Take 1 capsule (500 mg total) by mouth 2 (two) times daily. -     Urine Culture     Continue all other maintenance medications.  Follow up plan: Return if symptoms worsen or fail to improve.   Continue healthy lifestyle choices, including diet (rich in fruits, vegetables, and lean proteins, and low in salt and simple carbohydrates) and exercise (at least 30 minutes of moderate physical activity daily).  Educational handout given for UTI  The above assessment and management plan was discussed with the patient. The patient verbalized understanding of and has agreed to the management plan. Patient is aware to call the clinic if they develop any new symptoms or if symptoms persist or worsen. Patient is aware when to return to the clinic for a follow-up visit. Patient educated on when it is appropriate to go to the emergency department.   Kari Baars, FNP-C [facility] [phone]

## 2022-07-15 LAB — URINE CULTURE

## 2022-08-06 ENCOUNTER — Telehealth: Payer: Self-pay | Admitting: Family

## 2022-08-06 DIAGNOSIS — R3989 Other symptoms and signs involving the genitourinary system: Secondary | ICD-10-CM

## 2022-08-06 NOTE — Telephone Encounter (Signed)
Message routed to wrong person. Needs to be routed to Rakes since pt had visit with her.

## 2022-08-06 NOTE — Telephone Encounter (Signed)
Patient aware and verbalizes understanding.  Future order placed

## 2022-08-07 ENCOUNTER — Other Ambulatory Visit: Payer: Medicare Other

## 2022-08-07 DIAGNOSIS — R3989 Other symptoms and signs involving the genitourinary system: Secondary | ICD-10-CM

## 2022-08-07 LAB — MICROSCOPIC EXAMINATION: Renal Epithel, UA: NONE SEEN /hpf

## 2022-08-07 LAB — URINALYSIS, ROUTINE W REFLEX MICROSCOPIC
Bilirubin, UA: NEGATIVE
Glucose, UA: NEGATIVE
Nitrite, UA: NEGATIVE
Protein,UA: NEGATIVE
Specific Gravity, UA: 1.02 (ref 1.005–1.030)
Urobilinogen, Ur: 0.2 mg/dL (ref 0.2–1.0)
pH, UA: 5.5 (ref 5.0–7.5)

## 2022-08-09 LAB — URINE CULTURE

## 2022-12-16 ENCOUNTER — Ambulatory Visit (INDEPENDENT_AMBULATORY_CARE_PROVIDER_SITE_OTHER): Payer: Medicare Other | Admitting: Family

## 2022-12-16 ENCOUNTER — Encounter: Payer: Self-pay | Admitting: Family

## 2022-12-16 VITALS — BP 135/88 | HR 71 | Temp 98.1°F | Ht 61.0 in | Wt 160.4 lb

## 2022-12-16 DIAGNOSIS — E781 Pure hyperglyceridemia: Secondary | ICD-10-CM

## 2022-12-16 DIAGNOSIS — M159 Polyosteoarthritis, unspecified: Secondary | ICD-10-CM | POA: Diagnosis not present

## 2022-12-16 DIAGNOSIS — E669 Obesity, unspecified: Secondary | ICD-10-CM

## 2022-12-16 DIAGNOSIS — K219 Gastro-esophageal reflux disease without esophagitis: Secondary | ICD-10-CM

## 2022-12-16 DIAGNOSIS — F411 Generalized anxiety disorder: Secondary | ICD-10-CM | POA: Diagnosis not present

## 2022-12-16 DIAGNOSIS — M8580 Other specified disorders of bone density and structure, unspecified site: Secondary | ICD-10-CM

## 2022-12-16 NOTE — Progress Notes (Signed)
Subjective:    Patient ID: Alison White, female    DOB: [birth year], 67 y.o.   MRN: [id number]  Chief Complaint  Patient presents with   Medical Management of Chronic Issues   Pt presents to the office today for  chronic follow up.    She has osteopenia. She states she can not take calcium and vit D daily because it causes constipation. Her last dexa scan was 05/22/21. Gastroesophageal Reflux She complains of belching, heartburn and a hoarse voice. This is a chronic problem. The current episode started more than 1 year ago. The problem occurs occasionally. Risk factors include obesity. She has tried a PPI for the symptoms. The treatment provided moderate relief.  Arthritis Presents for follow-up visit. She complains of pain and stiffness. Affected locations include the left knee, right knee, left MCP and right MCP. Her pain is at a severity of 4/10.  Hyperlipidemia This is a chronic problem. The current episode started more than 1 year ago. The problem is controlled. Recent lipid tests were reviewed and are normal. Current antihyperlipidemic treatment includes statins. The current treatment provides moderate improvement of lipids. Risk factors for coronary artery disease include dyslipidemia, a sedentary lifestyle, hypertension and post-menopausal.  Anxiety Presents for follow-up visit. Symptoms include depressed mood, excessive worry, nervous/anxious behavior and restlessness. Symptoms occur occasionally. The severity of symptoms is moderate.        Review of Systems  HENT:  Positive for hoarse voice.   Gastrointestinal:  Positive for heartburn.  Musculoskeletal:  Positive for arthritis and stiffness.  Psychiatric/Behavioral:  The patient is nervous/anxious.   All other systems reviewed and are negative.      Objective:   Physical Exam Vitals reviewed.  Constitutional:      General: She is not in acute distress.    Appearance: She is well-developed.  HENT:     Head:  Normocephalic and atraumatic.     Right Ear: Tympanic membrane and external ear normal.     Left Ear: Tympanic membrane normal.  Eyes:     Pupils: Pupils are equal, round, and reactive to light.  Neck:     Thyroid: No thyromegaly.  Cardiovascular:     Rate and Rhythm: Normal rate and regular rhythm.     Heart sounds: Normal heart sounds. No murmur heard. Pulmonary:     Effort: Pulmonary effort is normal. No respiratory distress.     Breath sounds: Normal breath sounds. No wheezing.  Abdominal:     General: Bowel sounds are normal. There is no distension.     Palpations: Abdomen is soft.     Tenderness: There is no abdominal tenderness.  Musculoskeletal:        General: No tenderness. Normal range of motion.     Cervical back: Normal range of motion and neck supple.  Skin:    General: Skin is warm and dry.  Neurological:     Mental Status: She is alert and oriented to person, place, and time.     Cranial Nerves: No cranial nerve deficit.     Deep Tendon Reflexes: Reflexes are normal and symmetric.  Psychiatric:        Behavior: Behavior normal.        Thought Content: Thought content normal.        Judgment: Judgment normal.       BP 135/88   Pulse 71   Temp 98.1 F (36.7 C) (Temporal)   Ht 5\' 1"  (1.549 m)   Wt 160  lb 6.4 oz (72.8 kg)   SpO2 95%   BMI 30.31 kg/m      Assessment & Plan:  Alison White comes in today with chief complaint of Medical Management of Chronic Issues   Diagnosis and orders addressed:  1. GAD (generalized anxiety disorder) - CMP14+EGFR - CBC with Differential/Platelet  2. Gastroesophageal reflux disease without esophagitis - CMP14+EGFR - CBC with Differential/Platelet  3. Obesity (BMI 30-39.9) - CMP14+EGFR - CBC with Differential/Platelet  4. Osteoarthritis of multiple joints, unspecified osteoarthritis type - CMP14+EGFR - CBC with Differential/Platelet  5. Osteopenia, unspecified location - CMP14+EGFR - CBC with  Differential/Platelet  6. Hypertriglyceridemia - CMP14+EGFR - CBC with Differential/Platelet   Labs pending Health Maintenance reviewed Diet and exercise encouraged  Follow up plan: 6 months    Jannifer Rodney, FNP

## 2022-12-16 NOTE — Patient Instructions (Signed)
Health Maintenance After Age 67 After age 67, you are at a higher risk for certain long-term diseases and infections as well as injuries from falls. Falls are a major cause of broken bones and head injuries in people who are older than age 67. Getting regular preventive care can help to keep you healthy and well. Preventive care includes getting regular testing and making lifestyle changes as recommended by your health care provider. Talk with your health care provider about: Which screenings and tests you should have. A screening is a test that checks for a disease when you have no symptoms. A diet and exercise plan that is right for you. What should I know about screenings and tests to prevent falls? Screening and testing are the best ways to find a health problem early. Early diagnosis and treatment give you the best chance of managing medical conditions that are common after age 67. Certain conditions and lifestyle choices may make you more likely to have a fall. Your health care provider may recommend: Regular vision checks. Poor vision and conditions such as cataracts can make you more likely to have a fall. If you wear glasses, make sure to get your prescription updated if your vision changes. Medicine review. Work with your health care provider to regularly review all of the medicines you are taking, including over-the-counter medicines. Ask your health care provider about any side effects that may make you more likely to have a fall. Tell your health care provider if any medicines that you take make you feel dizzy or sleepy. Strength and balance checks. Your health care provider may recommend certain tests to check your strength and balance while standing, walking, or changing positions. Foot health exam. Foot pain and numbness, as well as not wearing proper footwear, can make you more likely to have a fall. Screenings, including: Osteoporosis screening. Osteoporosis is a condition that causes  the bones to get weaker and break more easily. Blood pressure screening. Blood pressure changes and medicines to control blood pressure can make you feel dizzy. Depression screening. You may be more likely to have a fall if you have a fear of falling, feel depressed, or feel unable to do activities that you used to do. Alcohol use screening. Using too much alcohol can affect your balance and may make you more likely to have a fall. Follow these instructions at home: Lifestyle Do not drink alcohol if: Your health care provider tells you not to drink. If you drink alcohol: Limit how much you have to: 0-1 drink a day for women. 0-2 drinks a day for men. Know how much alcohol is in your drink. In the [place] one drink equals one 12 oz bottle of beer (355 mL), one 5 oz glass of wine (148 mL), or one 1 oz glass of hard liquor (44 mL). Do not use any products that contain nicotine or tobacco. These products include cigarettes, chewing tobacco, and vaping devices, such as e-cigarettes. If you need help quitting, ask your health care provider. Activity  Follow a regular exercise program to stay fit. This will help you maintain your balance. Ask your health care provider what types of exercise are appropriate for you. If you need a cane or walker, use it as recommended by your health care provider. Wear supportive shoes that have nonskid soles. Safety  Remove any tripping hazards, such as rugs, cords, and clutter. Install safety equipment such as grab bars in bathrooms and safety rails on stairs. Keep rooms and walkways   well-lit. General instructions Talk with your health care provider about your risks for falling. Tell your health care provider if: You fall. Be sure to tell your health care provider about all falls, even ones that seem minor. You feel dizzy, tiredness (fatigue), or off-balance. Take over-the-counter and prescription medicines only as told by your health care provider. These include  supplements. Eat a healthy diet and maintain a healthy weight. A healthy diet includes low-fat dairy products, low-fat (lean) meats, and fiber from whole grains, beans, and lots of fruits and vegetables. Stay current with your vaccines. Schedule regular health, dental, and eye exams. Summary Having a healthy lifestyle and getting preventive care can help to protect your health and wellness after age 67. Screening and testing are the best way to find a health problem early and help you avoid having a fall. Early diagnosis and treatment give you the best chance for managing medical conditions that are more common for people who are older than age 67. Falls are a major cause of broken bones and head injuries in people who are older than age 67. Take precautions to prevent a fall at home. Work with your health care provider to learn what changes you can make to improve your health and wellness and to prevent falls. This information is not intended to replace advice given to you by your health care provider. Make sure you discuss any questions you have with your health care provider. Document Revised: 07/24/2020 Document Reviewed: 07/24/2020 Elsevier Patient Education  2024 [employer]  

## 2022-12-17 LAB — CBC WITH DIFFERENTIAL/PLATELET
Basophils Absolute: 0 10*3/uL (ref 0.0–0.2)
Basos: 0 %
EOS (ABSOLUTE): 0.1 10*3/uL (ref 0.0–0.4)
Eos: 1 %
Hematocrit: 41 % (ref 34.0–46.6)
Hemoglobin: 13.3 g/dL (ref 11.1–15.9)
Immature Grans (Abs): 0 10*3/uL (ref 0.0–0.1)
Immature Granulocytes: 0 %
Lymphocytes Absolute: 1.6 10*3/uL (ref 0.7–3.1)
Lymphs: 20 %
MCH: 33.2 pg — ABNORMAL HIGH (ref 26.6–33.0)
MCHC: 32.4 g/dL (ref 31.5–35.7)
MCV: 102 fL — ABNORMAL HIGH (ref 79–97)
Monocytes Absolute: 0.8 10*3/uL (ref 0.1–0.9)
Monocytes: 10 %
Neutrophils Absolute: 5.2 10*3/uL (ref 1.4–7.0)
Neutrophils: 69 %
Platelets: 265 10*3/uL (ref 150–450)
RBC: 4.01 x10E6/uL (ref 3.77–5.28)
RDW: 12.2 % (ref 11.7–15.4)
WBC: 7.6 10*3/uL (ref 3.4–10.8)

## 2022-12-17 LAB — CMP14+EGFR
ALT: 19 [IU]/L (ref 0–32)
AST: 21 [IU]/L (ref 0–40)
Albumin: 4.7 g/dL (ref 3.9–4.9)
Alkaline Phosphatase: 100 [IU]/L (ref 44–121)
BUN/Creatinine Ratio: 17 (ref 12–28)
BUN: 15 mg/dL (ref 8–27)
Bilirubin Total: 0.3 mg/dL (ref 0.0–1.2)
CO2: 21 mmol/L (ref 20–29)
Calcium: 9.6 mg/dL (ref 8.7–10.3)
Chloride: 107 mmol/L — ABNORMAL HIGH (ref 96–106)
Creatinine, Ser: 0.88 mg/dL (ref 0.57–1.00)
Globulin, Total: 2.3 g/dL (ref 1.5–4.5)
Glucose: 90 mg/dL (ref 70–99)
Potassium: 4.3 mmol/L (ref 3.5–5.2)
Sodium: 144 mmol/L (ref 134–144)
Total Protein: 7 g/dL (ref 6.0–8.5)
eGFR: 72 mL/min/{1.73_m2} (ref >59)

## 2023-01-01 ENCOUNTER — Ambulatory Visit (INDEPENDENT_AMBULATORY_CARE_PROVIDER_SITE_OTHER): Payer: Medicare Other

## 2023-01-01 DIAGNOSIS — Z23 Encounter for immunization: Secondary | ICD-10-CM | POA: Diagnosis not present

## 2023-01-21 ENCOUNTER — Other Ambulatory Visit: Payer: Self-pay | Admitting: Family

## 2023-01-21 DIAGNOSIS — M791 Myalgia, unspecified site: Secondary | ICD-10-CM

## 2023-01-21 DIAGNOSIS — M19041 Primary osteoarthritis, right hand: Secondary | ICD-10-CM

## 2023-01-22 ENCOUNTER — Ambulatory Visit: Payer: Medicare Other

## 2023-01-22 VITALS — Ht 60.0 in | Wt 165.0 lb

## 2023-01-22 DIAGNOSIS — Z0000 Encounter for general adult medical examination without abnormal findings: Secondary | ICD-10-CM

## 2023-01-22 NOTE — Patient Instructions (Signed)
Alison White , Thank you for taking time to come for your Medicare Wellness Visit. I appreciate your ongoing commitment to your health goals. Please review the following plan we discussed and let me know if I can assist you in the future.   Referrals/Orders/Follow-Ups/Clinician Recommendations: Aim for 30 minutes of exercise or brisk walking, 6-8 glasses of water, and 5 servings of fruits and vegetables each day.   This is a list of the screening recommended for you and due dates:  Health Maintenance  Topic Date Due   COVID-19 Vaccine (4 - 2023-24 season) 11/17/2022   DEXA scan (bone density measurement)  05/25/2023   Medicare Annual Wellness Visit  01/22/2024   Mammogram  03/08/2024   DTaP/Tdap/Td vaccine (2 - Td or Tdap) 11/14/2027   Colon Cancer Screening  08/18/2031   Pneumonia Vaccine  Completed   Flu Shot  Completed   Hepatitis C Screening  Completed   Zoster (Shingles) Vaccine  Completed   HPV Vaccine  Aged Out    Advanced directives: (Copy Requested) Please bring a copy of your health care power of attorney and living will to the office to be added to your chart at your convenience.  Next Medicare Annual Wellness Visit scheduled for next year: Yes  Insert Preventive Care attachment Insert FALL PREVENTION attachment if needed

## 2023-01-22 NOTE — Progress Notes (Signed)
Subjective:   Alison White is a 67 y.o. female who presents for an Initial Medicare Annual Wellness Visit.  Visit Complete: Virtual I connected with  Alison White on 01/22/23 by a audio enabled telemedicine application and verified that I am speaking with the correct person using two identifiers.  Patient Location: Home  Provider Location: Home Office  I discussed the limitations of evaluation and management by telemedicine. The patient expressed understanding and agreed to proceed.  Vital Signs: Because this visit was a virtual/telehealth visit, some criteria may be missing or patient reported. Any vitals not documented were not able to be obtained and vitals that have been documented are patient reported.  Patient Medicare AWV questionnaire was completed by the patient on 01/22/2023; I have confirmed that all information answered by patient is correct and no changes since this date.  Cardiac Risk Factors include: advanced age (>54men, >14 women)     Objective:    Today's Vitals   01/22/23 1034  Weight: 165 lb (74.8 kg)  Height: 5' (1.524 m)   Body mass index is 32.22 kg/m.     01/22/2023   10:37 AM  Advanced Directives  Does Patient Have a Medical Advance Directive? Yes  Type of Estate agent of [place];Living will  Copy of Healthcare Power of Attorney in Chart? No - copy requested    Current Medications (verified) Outpatient Encounter Medications as of 01/22/2023  Medication Sig   busPIRone (BUSPAR) 7.5 MG tablet Take 1 tablet (7.5 mg total) by mouth 3 (three) times daily as needed.   meloxicam (MOBIC) 7.5 MG tablet TAKE ONE TABLET BY MOUTH DAILY   pantoprazole (PROTONIX) 20 MG tablet Take 2 tablets (40 mg total) by mouth daily.   rosuvastatin (CRESTOR) 5 MG tablet Take 1 tablet (5 mg total) by mouth daily.   No facility-administered encounter medications on file as of 01/22/2023.    Allergies (verified) Lexapro [escitalopram oxalate]    History: Past Medical History:  Diagnosis Date   Anxiety    Past Surgical History:  Procedure Laterality Date   ABDOMINAL HYSTERECTOMY     APPENDECTOMY     CHOLECYSTECTOMY     KNEE CARTILAGE SURGERY Left    Family History  Problem Relation Age of Onset   Alzheimer's disease Mother    Cancer Father        prostate   Social History   Socioeconomic History   Marital status: Widowed    Spouse name: Not on file   Number of children: Not on file   Years of education: Not on file   Highest education level: Not on file  Occupational History   Not on file  Tobacco Use   Smoking status: Former    Current packs/day: 0.00    Types: Cigarettes    Quit date: 03/18/1997    Years since quitting: 25.8   Smokeless tobacco: Never  Vaping Use   Vaping status: Never Used  Substance and Sexual Activity   Alcohol use: Yes    Comment: Drinks 4 beer daily.   Drug use: No   Sexual activity: Not on file  Other Topics Concern   Not on file  Social History Narrative   Not on file   Social Determinants of Health   Financial Resource Strain: Low Risk  (01/22/2023)   Overall Financial Resource Strain (CARDIA)    Difficulty of Paying Living Expenses: Not hard at all  Food Insecurity: No Food Insecurity (01/22/2023)   Hunger Vital  Sign    Worried About Programme researcher, broadcasting/film/video in the Last Year: Never true    Ran Out of Food in the Last Year: Never true  Transportation Needs: No Transportation Needs (01/22/2023)   PRAPARE - Administrator, Civil Service (Medical): No    Lack of Transportation (Non-Medical): No  Physical Activity: Insufficiently Active (01/22/2023)   Exercise Vital Sign    Days of Exercise per Week: 4 days    Minutes of Exercise per Session: 30 min  Stress: No Stress Concern Present (01/22/2023)   [employer] of Occupational Health - Occupational Stress Questionnaire    Feeling of Stress : Not at all  Social Connections: Moderately Isolated (01/22/2023)    Social Connection and Isolation Panel [NHANES]    Frequency of Communication with Friends and Family: More than three times a week    Frequency of Social Gatherings with Friends and Family: Never    Attends Religious Services: More than 4 times per year    Active Member of [employer] or Organizations: No    Attends Banker Meetings: Never    Marital Status: Widowed    Tobacco Counseling Counseling given: Not Answered   Clinical Intake:  Pre-visit preparation completed: Yes  Pain : No/denies pain     Nutritional Risks: None Diabetes: No  How often do you need to have someone help you when you read instructions, pamphlets, or other written materials from your doctor or pharmacy?: 1 - Never  Interpreter Needed?: No  Information entered by :: Renie Ora, LPN   Activities of Daily Living    01/22/2023   10:37 AM  In your present state of health, do you have any difficulty performing the following activities:  Hearing? 0  Vision? 0  Difficulty concentrating or making decisions? 0  Walking or climbing stairs? 0  Dressing or bathing? 0  Doing errands, shopping? 0  Preparing Food and eating ? N  Using the Toilet? N  In the past six months, have you accidently leaked urine? N  Do you have problems with loss of bowel control? N  Managing your Medications? N  Managing your Finances? N  Housekeeping or managing your Housekeeping? N    Patient Care Team: Junie Spencer, FNP as PCP - General (Family Medicine)  Indicate any recent Medical Services you may have received from other than [facility] providers in the past year (date may be approximate).     Assessment:   This is a routine wellness examination for [place].  Hearing/Vision screen Vision Screening - Comments:: Wears rx glasses - up to date with routine eye exams with  Dr.johnson    Goals Addressed             This Visit's Progress    DIET - EAT MORE FRUITS AND VEGETABLES         Depression  Screen    01/22/2023   10:36 AM 12/16/2022    3:35 PM 07/12/2022    7:57 AM 06/13/2022    1:59 PM 04/12/2022   12:04 PM 04/12/2022    9:59 AM 12/13/2021   12:25 PM  PHQ 2/9 Scores  PHQ - 2 Score 0 0 0 0 0 0 0  PHQ- 9 Score 0 0  0  0 0    Fall Risk    01/22/2023   10:35 AM 12/16/2022    3:34 PM 07/12/2022    7:56 AM 06/13/2022    1:59 PM 04/12/2022    9:58  AM  Fall Risk   Falls in the past year? 0 0 0 0 0  Number falls in past yr: 0 0  0   Injury with Fall? 0 0  0   Risk for fall due to : No Fall Risks Orthopedic patient;No Fall Risks  History of fall(s)   Follow up Falls prevention discussed Falls evaluation completed  Falls evaluation completed     MEDICARE RISK AT HOME: Medicare Risk at Home Any stairs in or around the home?: No If so, are there any without handrails?: No Home free of loose throw rugs in walkways, pet beds, electrical cords, etc?: Yes Adequate lighting in your home to reduce risk of falls?: Yes Life alert?: No Use of a cane, walker or w/c?: No Grab bars in the bathroom?: Yes Shower chair or bench in shower?: Yes Elevated toilet seat or a handicapped toilet?: Yes  TIMED UP AND GO:  Was the test performed? No    Cognitive Function:        01/22/2023   10:37 AM  6CIT Screen  What Year? 0 points  What month? 0 points  What time? 0 points  Count back from 20 0 points  Months in reverse 0 points  Repeat phrase 0 points  Total Score 0 points    Immunizations Immunization History  Administered Date(s) Administered   Fluad Quad(high Dose 65+) 12/29/2020, 01/02/2022   Fluad Trivalent(High Dose 65+) 01/01/2023   Influenza Split 12/10/2012   Influenza,inj,Quad PF,6+ Mos 12/07/2013, 12/15/2014, 12/05/2015, 12/24/2016, 01/02/2018, 12/23/2018, 12/31/2019   Moderna Sars-Covid-2 Vaccination 07/12/2019, 08/10/2019, 04/11/2020   PNEUMOCOCCAL CONJUGATE-20 04/12/2022   Tdap 11/13/2017   Zoster Recombinant(Shingrix) 09/29/2020, 01/30/2021    TDAP status: Up  to date  Flu Vaccine status: Up to date  Pneumococcal vaccine status: Up to date  Covid-19 vaccine status: Completed vaccines  Qualifies for Shingles Vaccine? Yes   Zostavax completed Yes   Shingrix Completed?: Yes  Screening Tests Health Maintenance  Topic Date Due   COVID-19 Vaccine (4 - 2023-24 season) 11/17/2022   DEXA SCAN  05/25/2023   Medicare Annual Wellness (AWV)  01/22/2024   MAMMOGRAM  03/08/2024   DTaP/Tdap/Td (2 - Td or Tdap) 11/14/2027   Colonoscopy  08/18/2031   Pneumonia Vaccine 10+ Years old  Completed   INFLUENZA VACCINE  Completed   Hepatitis C Screening  Completed   Zoster Vaccines- Shingrix  Completed   HPV VACCINES  Aged Out    Health Maintenance  Health Maintenance Due  Topic Date Due   COVID-19 Vaccine (4 - 2023-24 season) 11/17/2022    Colorectal cancer screening: Type of screening: Colonoscopy. Completed 08/17/2021. Repeat every 10 years  Mammogram status: Completed 03/08/2022. Repeat every year  Bone Density status: Completed 05/24/2021. Results reflect: Bone density results: OSTEOPOROSIS. Repeat every 2 years.  Lung Cancer Screening: (Low Dose CT Chest recommended if Age 71-80 years, 20 pack-year currently smoking OR have quit w/in 15years.) does not qualify.   Lung Cancer Screening Referral: n/a  Additional Screening:  Hepatitis C Screening: does not qualify; Completed 11/13/2017  Vision Screening: Recommended annual ophthalmology exams for early detection of glaucoma and other disorders of the eye. Is the patient up to date with their annual eye exam?  Yes  Who is the provider or what is the name of the office in which the patient attends annual eye exams? Dr.johnson  If pt is not established with a provider, would they like to be referred to a provider to establish care? No .  Dental Screening: Recommended annual dental exams for proper oral hygiene  Community Resource Referral / Chronic Care Management: CRR required this visit?   No   CCM required this visit?  No     Plan:     I have personally reviewed and noted the following in the patient's chart:   Medical and social history Use of alcohol, tobacco or illicit drugs  Current medications and supplements including opioid prescriptions. Patient is not currently taking opioid prescriptions. Functional ability and status Nutritional status Physical activity Advanced directives List of other physicians Hospitalizations, surgeries, and ER visits in previous 12 months Vitals Screenings to include cognitive, depression, and falls Referrals and appointments  In addition, I have reviewed and discussed with patient certain preventive protocols, quality metrics, and best practice recommendations. A written personalized care plan for preventive services as well as general preventive health recommendations were provided to patient.     Lorrene Reid, LPN   45/06/979   After Visit Summary: (MyChart) Due to this being a telephonic visit, the after visit summary with patients personalized plan was offered to patient via MyChart   Nurse Notes: none

## 2023-04-18 DIAGNOSIS — Z1231 Encounter for screening mammogram for malignant neoplasm of breast: Secondary | ICD-10-CM | POA: Diagnosis not present

## 2023-04-18 LAB — HM MAMMOGRAPHY

## 2023-04-29 ENCOUNTER — Other Ambulatory Visit: Payer: Self-pay | Admitting: Family

## 2023-04-29 DIAGNOSIS — K219 Gastro-esophageal reflux disease without esophagitis: Secondary | ICD-10-CM

## 2023-06-13 ENCOUNTER — Ambulatory Visit (INDEPENDENT_AMBULATORY_CARE_PROVIDER_SITE_OTHER)

## 2023-06-13 ENCOUNTER — Encounter: Payer: Self-pay | Admitting: Family

## 2023-06-13 ENCOUNTER — Ambulatory Visit: Payer: Medicare Other | Admitting: Family

## 2023-06-13 VITALS — BP 131/74 | HR 80 | Temp 97.7°F

## 2023-06-13 DIAGNOSIS — M8580 Other specified disorders of bone density and structure, unspecified site: Secondary | ICD-10-CM

## 2023-06-13 DIAGNOSIS — F411 Generalized anxiety disorder: Secondary | ICD-10-CM | POA: Diagnosis not present

## 2023-06-13 DIAGNOSIS — M85852 Other specified disorders of bone density and structure, left thigh: Secondary | ICD-10-CM | POA: Diagnosis not present

## 2023-06-13 DIAGNOSIS — M791 Myalgia, unspecified site: Secondary | ICD-10-CM

## 2023-06-13 DIAGNOSIS — Z780 Asymptomatic menopausal state: Secondary | ICD-10-CM | POA: Diagnosis not present

## 2023-06-13 DIAGNOSIS — K219 Gastro-esophageal reflux disease without esophagitis: Secondary | ICD-10-CM | POA: Diagnosis not present

## 2023-06-13 DIAGNOSIS — M19042 Primary osteoarthritis, left hand: Secondary | ICD-10-CM | POA: Diagnosis not present

## 2023-06-13 DIAGNOSIS — Z0000 Encounter for general adult medical examination without abnormal findings: Secondary | ICD-10-CM | POA: Diagnosis not present

## 2023-06-13 DIAGNOSIS — M19041 Primary osteoarthritis, right hand: Secondary | ICD-10-CM

## 2023-06-13 DIAGNOSIS — Z0001 Encounter for general adult medical examination with abnormal findings: Secondary | ICD-10-CM | POA: Diagnosis not present

## 2023-06-13 DIAGNOSIS — M159 Polyosteoarthritis, unspecified: Secondary | ICD-10-CM

## 2023-06-13 DIAGNOSIS — E785 Hyperlipidemia, unspecified: Secondary | ICD-10-CM

## 2023-06-13 DIAGNOSIS — E669 Obesity, unspecified: Secondary | ICD-10-CM

## 2023-06-13 MED ORDER — MELOXICAM 7.5 MG PO TABS
7.5000 mg | ORAL_TABLET | Freq: Every day | ORAL | 1 refills | Status: DC
Start: 2023-06-13 — End: 2023-12-15

## 2023-06-13 MED ORDER — ROSUVASTATIN CALCIUM 5 MG PO TABS
5.0000 mg | ORAL_TABLET | Freq: Every day | ORAL | 3 refills | Status: DC
Start: 2023-06-13 — End: 2023-06-16

## 2023-06-13 MED ORDER — PANTOPRAZOLE SODIUM 20 MG PO TBEC
40.0000 mg | DELAYED_RELEASE_TABLET | Freq: Every day | ORAL | 1 refills | Status: DC
Start: 2023-06-13 — End: 2023-12-15

## 2023-06-13 MED ORDER — BUSPIRONE HCL 7.5 MG PO TABS
7.5000 mg | ORAL_TABLET | Freq: Three times a day (TID) | ORAL | 6 refills | Status: AC | PRN
Start: 2023-06-13 — End: ?

## 2023-06-13 NOTE — Progress Notes (Signed)
 Subjective:    Patient ID: Alison White, female    DOB: 12/14/1955, 68 y.o.   MRN: [id number]  Chief Complaint  Patient presents with   Medical Management of Chronic Issues   Pt presents to the office today for  CPE and chronic follow up.    She has osteopenia. She states she can not take calcium and vit D daily because it causes constipation. Her last dexa scan was 05/22/21. Gastroesophageal Reflux She complains of belching, heartburn and a hoarse voice. This is a chronic problem. The current episode started more than 1 year ago. The problem occurs occasionally. The symptoms are aggravated by certain foods. Risk factors include obesity. She has tried a PPI and a diet change for the symptoms. The treatment provided moderate relief.  Arthritis Presents for follow-up visit. She complains of pain and stiffness. Affected locations include the left knee, right knee, left MCP and right MCP (lower back). Her pain is at a severity of 4/10.  Hyperlipidemia This is a chronic problem. The current episode started more than 1 year ago. The problem is uncontrolled. Recent lipid tests were reviewed and are high. Current antihyperlipidemic treatment includes statins. The current treatment provides moderate improvement of lipids. Risk factors for coronary artery disease include dyslipidemia, a sedentary lifestyle, hypertension and post-menopausal.  Anxiety Presents for follow-up visit. Symptoms include depressed mood, excessive worry, nervous/anxious behavior and restlessness. Symptoms occur occasionally. The severity of symptoms is mild.        Review of Systems  HENT:  Positive for hoarse voice.   Gastrointestinal:  Positive for heartburn.  Musculoskeletal:  Positive for stiffness.  Psychiatric/Behavioral:  The patient is nervous/anxious.   All other systems reviewed and are negative.  Family History  Problem Relation Age of Onset   Alzheimer's disease Mother    Cancer Father        prostate    Social History   Socioeconomic History   Marital status: Widowed    Spouse name: Not on file   Number of children: Not on file   Years of education: Not on file   Highest education level: Not on file  Occupational History   Not on file  Tobacco Use   Smoking status: Former    Current packs/day: 0.00    Types: Cigarettes    Quit date: 03/18/1997    Years since quitting: 26.2   Smokeless tobacco: Never  Vaping Use   Vaping status: Never Used  Substance and Sexual Activity   Alcohol use: Yes    Comment: Drinks 4 beer daily.   Drug use: No   Sexual activity: Not on file  Other Topics Concern   Not on file  Social History Narrative   Not on file   Social Drivers of Health   Financial Resource Strain: Low Risk  (01/22/2023)   Overall Financial Resource Strain (CARDIA)    Difficulty of Paying Living Expenses: Not hard at all  Food Insecurity: No Food Insecurity (01/22/2023)   Hunger Vital Sign    Worried About Running Out of Food in the Last Year: Never true    Ran Out of Food in the Last Year: Never true  Transportation Needs: No Transportation Needs (01/22/2023)   PRAPARE - Administrator, Civil Service (Medical): No    Lack of Transportation (Non-Medical): No  Physical Activity: Insufficiently Active (01/22/2023)   Exercise Vital Sign    Days of Exercise per Week: 4 days    Minutes of Exercise  per Session: 30 min  Stress: No Stress Concern Present (01/22/2023)   [employer] of Occupational Health - Occupational Stress Questionnaire    Feeling of Stress : Not at all  Social Connections: Moderately Isolated (01/22/2023)   Social Connection and Isolation Panel [NHANES]    Frequency of Communication with Friends and Family: More than three times a week    Frequency of Social Gatherings with Friends and Family: Never    Attends Religious Services: More than 4 times per year    Active Member of [employer] or Organizations: No    Attends Banker  Meetings: Never    Marital Status: Widowed        Objective:   Physical Exam Vitals reviewed.  Constitutional:      General: She is not in acute distress.    Appearance: She is well-developed.  HENT:     Head: Normocephalic and atraumatic.     Right Ear: Tympanic membrane normal.     Left Ear: Tympanic membrane normal.  Eyes:     Pupils: Pupils are equal, round, and reactive to light.  Neck:     Thyroid: No thyromegaly.  Cardiovascular:     Rate and Rhythm: Normal rate and regular rhythm.     Heart sounds: Normal heart sounds. No murmur heard. Pulmonary:     Effort: Pulmonary effort is normal. No respiratory distress.     Breath sounds: Normal breath sounds. No wheezing.  Abdominal:     General: Bowel sounds are normal. There is no distension.     Palpations: Abdomen is soft.     Tenderness: There is no abdominal tenderness.  Musculoskeletal:        General: No tenderness. Normal range of motion.     Cervical back: Normal range of motion and neck supple.  Skin:    General: Skin is warm and dry.  Neurological:     Mental Status: She is alert and oriented to person, place, and time.     Cranial Nerves: No cranial nerve deficit.     Deep Tendon Reflexes: Reflexes are normal and symmetric.  Psychiatric:        Behavior: Behavior normal.        Thought Content: Thought content normal.        Judgment: Judgment normal.       BP 131/74 Comment: at home  Pulse 80   Temp 97.7 F (36.5 C) (Temporal)   SpO2 96%      Assessment & Plan:  Alison White comes in today with chief complaint of Medical Management of Chronic Issues   Diagnosis and orders addressed:  1. GAD (generalized anxiety disorder) - busPIRone (BUSPAR) 7.5 MG tablet; Take 1 tablet (7.5 mg total) by mouth 3 (three) times daily as needed.  Dispense: 90 tablet; Refill: 6 - CMP14+EGFR - CBC with Differential/Platelet  2. Myalgia - meloxicam (MOBIC) 7.5 MG tablet; Take 1 tablet (7.5 mg total) by mouth  daily.  Dispense: 90 tablet; Refill: 1 - CMP14+EGFR - CBC with Differential/Platelet  3. Arthritis of both hands  - meloxicam (MOBIC) 7.5 MG tablet; Take 1 tablet (7.5 mg total) by mouth daily.  Dispense: 90 tablet; Refill: 1 - CMP14+EGFR - CBC with Differential/Platelet  4. Gastroesophageal reflux disease without esophagitis  - pantoprazole (PROTONIX) 20 MG tablet; Take 2 tablets (40 mg total) by mouth daily.  Dispense: 180 tablet; Refill: 1 - CMP14+EGFR - CBC with Differential/Platelet  5. Annual physical exam (Primary) - CMP14+EGFR - CBC  with Differential/Platelet - Lipid panel  6. Obesity (BMI 30-39.9) - CMP14+EGFR - CBC with Differential/Platelet  7. Osteoarthritis of multiple joints, unspecified osteoarthritis type  - CMP14+EGFR - CBC with Differential/Platelet  8. Osteopenia, unspecified location - DG [facility] DEXA; Future - CMP14+EGFR - CBC with Differential/Platelet  9. Hyperlipidemia, unspecified hyperlipidemia type - rosuvastatin (CRESTOR) 5 MG tablet; Take 1 tablet (5 mg total) by mouth daily.  Dispense: 90 tablet; Refill: 3 - CMP14+EGFR - CBC with Differential/Platelet - Lipid panel   Labs pending Continue current medications  Health Maintenance reviewed- Dexa today Diet and exercise encouraged  Follow up plan: 6 months    Jannifer Rodney, FNP

## 2023-06-13 NOTE — Patient Instructions (Signed)
 Osteopenia  Osteopenia is a loss of thickness (density) inside the bones. Another name for osteopenia is low bone mass. Mild osteopenia is a normal part of aging. It is not a disease, and it does not cause symptoms. However, if you have osteopenia and continue to lose bone mass, you could develop a condition that causes the bones to become thin and break more easily (osteoporosis). Osteoporosis can cause you to lose some height, have back pain, and have a stooped posture. Although osteopenia is not a disease, making changes to your lifestyle and diet can help to prevent osteopenia from developing into osteoporosis. What are the causes? Osteopenia is caused by loss of calcium in the bones. Bones are constantly changing. Old bone cells are continually being replaced with new bone cells. This process builds new bone. The mineral calcium is needed to build new bone and maintain bone density. Bone density is usually highest around age 24. After that, most people's bodies cannot replace all the bone they have lost with new bone. What increases the risk? You are more likely to develop this condition if: You are older than age 98. You are a woman who went through menopause early. You have a long illness that keeps you in bed. You do not get enough exercise. You lack certain nutrients (malnutrition). You have an overactive thyroid gland (hyperthyroidism). You use products that contain nicotine or tobacco, such as cigarettes, e-cigarettes and chewing tobacco, or you drink a lot of alcohol. You are taking medicines that weaken the bones, such as steroids. What are the signs or symptoms? This condition does not cause any symptoms. You may have a slightly higher risk for bone breaks (fractures), so getting fractures more easily than normal may be an indication of osteopenia. How is this diagnosed? This condition may be diagnosed based on an X-ray exam that measures bone density (dual-energy X-ray  absorptiometry, or DEXA). This test can measure bone density in your hips, spine, and wrists. Osteopenia has no symptoms, so this condition is usually diagnosed after a routine bone density screening test is done for osteoporosis. This routine screening is usually done for: Women who are age 11 or older. Men who are age 24 or older. If you have risk factors for osteopenia, you may have the screening test at an earlier age. How is this treated? Making dietary and lifestyle changes can lower your risk for osteoporosis. If you have severe osteopenia that is close to becoming osteoporosis, this condition can be treated with medicines and dietary supplements such as calcium and vitamin D. These supplements help to rebuild bone density. Follow these instructions at home: Eating and drinking Eat a diet that is high in calcium and vitamin D. Calcium is found in dairy products, beans, salmon, and leafy green vegetables like spinach and broccoli. Look for foods that have vitamin D and calcium added to them (fortified foods), such as orange juice, cereal, and bread.  Lifestyle Do 30 minutes or more of a weight-bearing exercise every day, such as walking, jogging, or playing a sport. These types of exercises strengthen the bones. Do not use any products that contain nicotine or tobacco, such as cigarettes, e-cigarettes, and chewing tobacco. If you need help quitting, ask your health care provider. Do not drink alcohol if: Your health care provider tells you not to drink. You are pregnant, may be pregnant, or are planning to become pregnant. If you drink alcohol: Limit how much you use to: 0-1 drink a day for women. 0-2  drinks a day for men. Be aware of how much alcohol is in your drink. In the [place] one drink equals one 12 oz bottle of beer (355 mL), one 5 oz glass of wine (148 mL), or one 1 oz glass of hard liquor (44 mL). General instructions Take over-the-counter and prescription medicines only as  told by your health care provider. These include vitamins and supplements. Take precautions at home to lower your risk of falling, such as: Keeping rooms well-lit and free of clutter, such as cords. Installing safety rails on stairs. Using rubber mats in the bathroom or other areas that are often wet or slippery. Keep all follow-up visits. This is important. Contact a health care provider if: You have not had a bone density screening for osteoporosis and you are: A woman who is age 109 or older. A man who is age 83 or older. You are a postmenopausal woman who has not had a bone density screening for osteoporosis. You are older than age 50 and you want to know if you should have bone density screening for osteoporosis. Summary Osteopenia is a loss of thickness (density) inside the bones. Another name for osteopenia is low bone mass. Osteopenia is not a disease, but it may increase your risk for a condition that causes the bones to become thin and break more easily (osteoporosis). You may be at risk for osteopenia if you are older than age 67 or if you are a woman who went through early menopause. Osteopenia does not cause any symptoms, but it can be diagnosed with a bone density screening test. Dietary and lifestyle changes are the first treatment for osteopenia. These may lower your risk for osteoporosis. This information is not intended to replace advice given to you by your health care provider. Make sure you discuss any questions you have with your health care provider. Document Revised: 11/19/2022 Document Reviewed: 11/19/2022 Elsevier Patient Education  2024 [employer].

## 2023-06-14 LAB — CBC WITH DIFFERENTIAL/PLATELET
Basophils Absolute: 0 10*3/uL (ref 0.0–0.2)
Basos: 1 %
EOS (ABSOLUTE): 0.1 10*3/uL (ref 0.0–0.4)
Eos: 2 %
Hematocrit: 41.7 % (ref 34.0–46.6)
Hemoglobin: 13.8 g/dL (ref 11.1–15.9)
Immature Grans (Abs): 0 10*3/uL (ref 0.0–0.1)
Immature Granulocytes: 0 %
Lymphocytes Absolute: 1.4 10*3/uL (ref 0.7–3.1)
Lymphs: 22 %
MCH: 32.6 pg (ref 26.6–33.0)
MCHC: 33.1 g/dL (ref 31.5–35.7)
MCV: 99 fL — ABNORMAL HIGH (ref 79–97)
Monocytes Absolute: 0.7 10*3/uL (ref 0.1–0.9)
Monocytes: 11 %
Neutrophils Absolute: 4 10*3/uL (ref 1.4–7.0)
Neutrophils: 64 %
Platelets: 253 10*3/uL (ref 150–450)
RBC: 4.23 x10E6/uL (ref 3.77–5.28)
RDW: 12.3 % (ref 11.7–15.4)
WBC: 6.2 10*3/uL (ref 3.4–10.8)

## 2023-06-14 LAB — CMP14+EGFR
ALT: 22 IU/L (ref 0–32)
AST: 26 IU/L (ref 0–40)
Albumin: 4.5 g/dL (ref 3.9–4.9)
Alkaline Phosphatase: 103 IU/L (ref 44–121)
BUN/Creatinine Ratio: 18 (ref 12–28)
BUN: 13 mg/dL (ref 8–27)
Bilirubin Total: 0.3 mg/dL (ref 0.0–1.2)
CO2: 21 mmol/L (ref 20–29)
Calcium: 9.9 mg/dL (ref 8.7–10.3)
Chloride: 107 mmol/L — ABNORMAL HIGH (ref 96–106)
Creatinine, Ser: 0.73 mg/dL (ref 0.57–1.00)
Globulin, Total: 2.3 g/dL (ref 1.5–4.5)
Glucose: 77 mg/dL (ref 70–99)
Potassium: 4.2 mmol/L (ref 3.5–5.2)
Sodium: 142 mmol/L (ref 134–144)
Total Protein: 6.8 g/dL (ref 6.0–8.5)
eGFR: 90 mL/min/{1.73_m2} (ref >59)

## 2023-06-14 LAB — LIPID PANEL
Chol/HDL Ratio: 3.4 ratio (ref 0.0–4.4)
Cholesterol, Total: 181 mg/dL (ref 100–199)
HDL: 53 mg/dL (ref >39)
LDL Chol Calc (NIH): 101 mg/dL — ABNORMAL HIGH (ref 0–99)
Triglycerides: 153 mg/dL — ABNORMAL HIGH (ref 0–149)
VLDL Cholesterol Cal: 27 mg/dL (ref 5–40)

## 2023-06-16 ENCOUNTER — Other Ambulatory Visit: Payer: Self-pay | Admitting: Family

## 2023-06-16 MED ORDER — ROSUVASTATIN CALCIUM 10 MG PO TABS: 10.0000 mg | ORAL_TABLET | Freq: Every day | ORAL | 3 refills | Status: AC

## 2023-06-23 ENCOUNTER — Telehealth: Payer: Self-pay

## 2023-06-23 NOTE — Telephone Encounter (Signed)
 Copied from CRM [id number]. Topic: Clinical - Prescription Issue >> Jun 23, 2023  3:52 PM Florestine Avers wrote: Reason for CRM: Patient called in stating that her medication was changed and the dosage was raised. She wants to know why and is requesting a urgent call back.

## 2023-06-24 NOTE — Telephone Encounter (Signed)
 Patient aware and verbalized understanding.

## 2023-07-21 ENCOUNTER — Encounter: Payer: Self-pay | Admitting: Family Medicine

## 2023-07-21 DIAGNOSIS — M542 Cervicalgia: Secondary | ICD-10-CM | POA: Diagnosis not present

## 2023-07-21 DIAGNOSIS — R519 Headache, unspecified: Secondary | ICD-10-CM | POA: Diagnosis not present

## 2023-07-21 DIAGNOSIS — R079 Chest pain, unspecified: Secondary | ICD-10-CM | POA: Diagnosis not present

## 2023-07-21 DIAGNOSIS — Z87891 Personal history of nicotine dependence: Secondary | ICD-10-CM | POA: Diagnosis not present

## 2023-07-21 DIAGNOSIS — I10 Essential (primary) hypertension: Secondary | ICD-10-CM | POA: Diagnosis not present

## 2023-07-21 DIAGNOSIS — R030 Elevated blood-pressure reading, without diagnosis of hypertension: Secondary | ICD-10-CM | POA: Diagnosis not present

## 2023-07-21 DIAGNOSIS — R42 Dizziness and giddiness: Secondary | ICD-10-CM | POA: Diagnosis not present

## 2023-07-21 DIAGNOSIS — J0100 Acute maxillary sinusitis, unspecified: Secondary | ICD-10-CM | POA: Diagnosis not present

## 2023-07-21 DIAGNOSIS — G319 Degenerative disease of nervous system, unspecified: Secondary | ICD-10-CM | POA: Diagnosis not present

## 2023-07-21 DIAGNOSIS — M47812 Spondylosis without myelopathy or radiculopathy, cervical region: Secondary | ICD-10-CM | POA: Diagnosis not present

## 2023-07-21 DIAGNOSIS — R600 Localized edema: Secondary | ICD-10-CM | POA: Diagnosis not present

## 2023-07-21 DIAGNOSIS — R001 Bradycardia, unspecified: Secondary | ICD-10-CM | POA: Diagnosis not present

## 2023-07-25 ENCOUNTER — Encounter: Payer: Self-pay | Admitting: Nurse Practitioner

## 2023-07-25 ENCOUNTER — Ambulatory Visit (INDEPENDENT_AMBULATORY_CARE_PROVIDER_SITE_OTHER): Admitting: Nurse Practitioner

## 2023-07-25 VITALS — BP 175/102 | HR 76 | Temp 98.2°F | Ht 60.0 in | Wt 165.0 lb

## 2023-07-25 DIAGNOSIS — I10 Essential (primary) hypertension: Secondary | ICD-10-CM

## 2023-07-25 DIAGNOSIS — G43009 Migraine without aura, not intractable, without status migrainosus: Secondary | ICD-10-CM

## 2023-07-25 MED ORDER — LISINOPRIL 40 MG PO TABS: 40.0000 mg | ORAL_TABLET | Freq: Every day | ORAL | 3 refills | Status: DC

## 2023-07-25 MED ORDER — KETOROLAC TROMETHAMINE 60 MG/2ML IM SOLN
60.0000 mg | Freq: Once | INTRAMUSCULAR | Status: AC
  Administered 2023-07-25: 60 mg via INTRAMUSCULAR

## 2023-07-25 NOTE — Progress Notes (Signed)
   Subjective:    Patient ID: Alison White, female    DOB: 08-Oct-1955, 68 y.o.   MRN: [id number]   Chief Complaint: hospital follow up  HPI  Patient went to the ED on 07/21/23 c/o left sided headache. She was dx with migraine and hypertension. She was treated with fentanyl,  zofran , decadron and ultram . Head CT and CTA were both negative. Blood pressure was 162/78, not sure if due to pain or if was causing pain she was experiencing. She was discharged home with no change to meds. Was told to follow up with her PCP about blood pressure. Today she is c/o pressure behind her left eye. Blood pressure elevated at home. Patient Active Problem List   Diagnosis Date Noted   Gastroesophageal reflux disease 06/13/2022   Hemorrhoids 08/02/2021   Osteopenia 05/28/2021   Osteoarthritis of multiple joints 08/25/2020   Hot flashes 09/25/2018   GAD (generalized anxiety disorder) 07/24/2018   Hypertriglyceridemia 11/18/2017   Obesity (BMI 30-39.9) 11/13/2017       Review of Systems  Eyes:  Positive for visual disturbance.  Cardiovascular:  Positive for chest pain and leg swelling.  Neurological:  Positive for dizziness and headaches.       Objective:   Physical Exam Constitutional:      Appearance: Normal appearance. She is obese.  Cardiovascular:     Rate and Rhythm: Normal rate and regular rhythm.     Heart sounds: Normal heart sounds.  Pulmonary:     Effort: Pulmonary effort is normal.     Breath sounds: Normal breath sounds.  Skin:    General: Skin is warm.  Neurological:     General: No focal deficit present.     Mental Status: She is alert and oriented to person, place, and time.  Psychiatric:        Mood and Affect: Mood normal.        Behavior: Behavior normal.    BP (!) 175/102   Pulse 76   Temp 98.2 F (36.8 C) (Temporal)   Ht 5' (1.524 m)   Wt 165 lb (74.8 kg)   BMI 32.22 kg/m         Assessment & Plan:   KASSIDI DECASAS in today with chief complaint of  Hospitalization Follow-up   1. Primary hypertension (Primary) Low sodium diet Take blood pressure meds ASAP Rest Keep diary of blood pressure - lisinopril (ZESTRIL) 40 MG tablet; Take 1 tablet (40 mg total) by mouth daily.  Dispense: 90 tablet; Refill: 3  2. Migraine without aura and without status migrainosus, not intractable Lay down and rest today - ketorolac  (TORADOL ) injection 60 mg    The above assessment and management plan was discussed with the patient. The patient verbalized understanding of and has agreed to the management plan. Patient is aware to call the clinic if symptoms persist or worsen. Patient is aware when to return to the clinic for a follow-up visit. Patient educated on when it is appropriate to go to the emergency department.   Mary-Margaret Gaylyn Keas, FNP

## 2023-07-25 NOTE — Patient Instructions (Signed)

## 2023-12-15 ENCOUNTER — Ambulatory Visit: Admitting: Family

## 2023-12-15 ENCOUNTER — Encounter: Payer: Self-pay | Admitting: Family

## 2023-12-15 VITALS — BP 123/71 | HR 79 | Temp 98.1°F | Ht 60.0 in | Wt 156.4 lb

## 2023-12-15 DIAGNOSIS — R030 Elevated blood-pressure reading, without diagnosis of hypertension: Secondary | ICD-10-CM | POA: Diagnosis not present

## 2023-12-15 DIAGNOSIS — M19041 Primary osteoarthritis, right hand: Secondary | ICD-10-CM

## 2023-12-15 DIAGNOSIS — M791 Myalgia, unspecified site: Secondary | ICD-10-CM | POA: Diagnosis not present

## 2023-12-15 DIAGNOSIS — F411 Generalized anxiety disorder: Secondary | ICD-10-CM | POA: Diagnosis not present

## 2023-12-15 DIAGNOSIS — E781 Pure hyperglyceridemia: Secondary | ICD-10-CM

## 2023-12-15 DIAGNOSIS — M159 Polyosteoarthritis, unspecified: Secondary | ICD-10-CM | POA: Diagnosis not present

## 2023-12-15 DIAGNOSIS — K219 Gastro-esophageal reflux disease without esophagitis: Secondary | ICD-10-CM

## 2023-12-15 DIAGNOSIS — M19042 Primary osteoarthritis, left hand: Secondary | ICD-10-CM

## 2023-12-15 DIAGNOSIS — E669 Obesity, unspecified: Secondary | ICD-10-CM

## 2023-12-15 MED ORDER — PANTOPRAZOLE SODIUM 20 MG PO TBEC
40.0000 mg | DELAYED_RELEASE_TABLET | Freq: Every day | ORAL | 1 refills | Status: AC
Start: 2023-12-15 — End: ?

## 2023-12-15 MED ORDER — MELOXICAM 7.5 MG PO TABS: 7.5000 mg | ORAL_TABLET | Freq: Every day | ORAL | 1 refills | Status: AC

## 2023-12-15 NOTE — Patient Instructions (Signed)

## 2023-12-15 NOTE — Progress Notes (Signed)
 Subjective:    Patient ID: Alison White, female    DOB: Sep 12, 1955, 68 y.o.   MRN: [id number]  Chief Complaint  Patient presents with   Medical Management of Chronic Issues   Pt presents to the office today for chronic follow up.    She has osteopenia. She states she is only taking her calcium  and vit D weekly causes it constipation. Her last dexa scan was 05/22/21.  Her BP is elevated today, but states at home it was 123/71. She did take a decongestant piror to coming to her appointment.  Gastroesophageal Reflux She complains of belching, heartburn and a hoarse voice. This is a chronic problem. The current episode started more than 1 year ago. The problem occurs occasionally. The symptoms are aggravated by certain foods. Risk factors include obesity. She has tried a PPI and a diet change for the symptoms. The treatment provided moderate relief.  Arthritis Presents for follow-up visit. She complains of pain and stiffness. Affected locations include the left knee, right knee, left MCP and right MCP (lower back). Her pain is at a severity of 4/10.  Hyperlipidemia This is a chronic problem. The current episode started more than 1 year ago. The problem is uncontrolled. Recent lipid tests were reviewed and are high. Current antihyperlipidemic treatment includes statins. The current treatment provides moderate improvement of lipids. Risk factors for coronary artery disease include dyslipidemia, a sedentary lifestyle, hypertension and post-menopausal.  Anxiety Presents for follow-up visit. Symptoms include depressed mood, excessive worry, nervous/anxious behavior and restlessness. Symptoms occur occasionally. The severity of symptoms is mild.        Review of Systems  HENT:  Positive for hoarse voice.   Gastrointestinal:  Positive for heartburn.  Musculoskeletal:  Positive for stiffness.  Psychiatric/Behavioral:  The patient is nervous/anxious.   All other systems reviewed and are  negative.  Family History  Problem Relation Age of Onset   Alzheimer's disease Mother    Cancer Father        prostate   Social History   Socioeconomic History   Marital status: Widowed    Spouse name: Not on file   Number of children: Not on file   Years of education: Not on file   Highest education level: Not on file  Occupational History   Not on file  Tobacco Use   Smoking status: Former    Current packs/day: 0.00    Types: Cigarettes    Quit date: 03/18/1997    Years since quitting: 26.7   Smokeless tobacco: Never  Vaping Use   Vaping status: Never Used  Substance and Sexual Activity   Alcohol use: Yes    Comment: Drinks 4 beer daily.   Drug use: No   Sexual activity: Not on file  Other Topics Concern   Not on file  Social History Narrative   Not on file   Social Drivers of Health   Financial Resource Strain: Low Risk  (01/22/2023)   Overall Financial Resource Strain (CARDIA)    Difficulty of Paying Living Expenses: Not hard at all  Food Insecurity: No Food Insecurity (01/22/2023)   Hunger Vital Sign    Worried About Running Out of Food in the Last Year: Never true    Ran Out of Food in the Last Year: Never true  Transportation Needs: No Transportation Needs (01/22/2023)   PRAPARE - Administrator, Civil Service (Medical): No    Lack of Transportation (Non-Medical): No  Physical Activity: Insufficiently Active (  01/22/2023)   Exercise Vital Sign    Days of Exercise per Week: 4 days    Minutes of Exercise per Session: 30 min  Stress: No Stress Concern Present (01/22/2023)   [employer] of Occupational Health - Occupational Stress Questionnaire    Feeling of Stress : Not at all  Social Connections: Moderately Isolated (01/22/2023)   Social Connection and Isolation Panel    Frequency of Communication with Friends and Family: More than three times a week    Frequency of Social Gatherings with Friends and Family: Never    Attends Religious  Services: More than 4 times per year    Active Member of [employer] or Organizations: No    Attends Banker Meetings: Never    Marital Status: Widowed        Objective:   Physical Exam Vitals reviewed.  Constitutional:      General: She is not in acute distress.    Appearance: She is well-developed.  HENT:     Head: Normocephalic and atraumatic.     Right Ear: Tympanic membrane normal.     Left Ear: Tympanic membrane normal.  Eyes:     Pupils: Pupils are equal, round, and reactive to light.  Neck:     Thyroid : No thyromegaly.  Cardiovascular:     Rate and Rhythm: Normal rate and regular rhythm.     Heart sounds: Normal heart sounds. No murmur heard. Pulmonary:     Effort: Pulmonary effort is normal. No respiratory distress.     Breath sounds: Normal breath sounds. No wheezing.  Abdominal:     General: Bowel sounds are normal. There is no distension.     Palpations: Abdomen is soft.     Tenderness: There is no abdominal tenderness.  Musculoskeletal:        General: No tenderness. Normal range of motion.     Cervical back: Normal range of motion and neck supple.  Skin:    General: Skin is warm and dry.  Neurological:     Mental Status: She is alert and oriented to person, place, and time.     Cranial Nerves: No cranial nerve deficit.     Deep Tendon Reflexes: Reflexes are normal and symmetric.  Psychiatric:        Behavior: Behavior normal.        Thought Content: Thought content normal.        Judgment: Judgment normal.       BP (!) 165/99   Pulse 79   Temp 98.1 F (36.7 C) (Temporal)   Ht 5' (1.524 m)   Wt 156 lb 6.4 oz (70.9 kg)   SpO2 96%   BMI 30.54 kg/m      Assessment & Plan:  Alison White comes in today with chief complaint of Medical Management of Chronic Issues   Diagnosis and orders addressed:  1. GAD (generalized anxiety disorder) - CMP14+EGFR  2. Myalgia - meloxicam  (MOBIC ) 7.5 MG tablet; Take 1 tablet (7.5 mg total) by mouth  daily.  Dispense: 90 tablet; Refill: 1 - CMP14+EGFR  3. Arthritis of both hands - meloxicam  (MOBIC ) 7.5 MG tablet; Take 1 tablet (7.5 mg total) by mouth daily.  Dispense: 90 tablet; Refill: 1 - CMP14+EGFR  4. Gastroesophageal reflux disease without esophagitis - pantoprazole  (PROTONIX ) 20 MG tablet; Take 2 tablets (40 mg total) by mouth daily.  Dispense: 180 tablet; Refill: 1 - CMP14+EGFR  5. Osteoarthritis of multiple joints, unspecified osteoarthritis type (Primary) - CMP14+EGFR  6.  Hypertriglyceridemia - CMP14+EGFR  7. Obesity (BMI 30-39.9) - CMP14+EGFR  8. Elevated blood pressure reading - CMP14+EGFR   Labs pending Will continue to monitor BP at home, brought reading from home for the last 30 days. All readings 120's-130's/70's Continue current medications  Health Maintenance reviewed Diet and exercise encouraged  Follow up plan: 4 months    Bari Learn, FNP

## 2023-12-16 ENCOUNTER — Ambulatory Visit: Payer: Self-pay | Admitting: Family

## 2023-12-16 LAB — CMP14+EGFR
ALT: 16 IU/L (ref 0–32)
AST: 19 IU/L (ref 0–40)
Albumin: 4.7 g/dL (ref 3.9–4.9)
Alkaline Phosphatase: 97 IU/L (ref 49–135)
BUN/Creatinine Ratio: 15 (ref 12–28)
BUN: 11 mg/dL (ref 8–27)
Bilirubin Total: 0.4 mg/dL (ref 0.0–1.2)
CO2: 22 mmol/L (ref 20–29)
Calcium: 9.8 mg/dL (ref 8.7–10.3)
Chloride: 106 mmol/L (ref 96–106)
Creatinine, Ser: 0.75 mg/dL (ref 0.57–1.00)
Globulin, Total: 2.3 g/dL (ref 1.5–4.5)
Glucose: 83 mg/dL (ref 70–99)
Potassium: 4.2 mmol/L (ref 3.5–5.2)
Sodium: 144 mmol/L (ref 134–144)
Total Protein: 7 g/dL (ref 6.0–8.5)
eGFR: 87 mL/min/1.73 (ref >59)

## 2024-01-05 ENCOUNTER — Ambulatory Visit (INDEPENDENT_AMBULATORY_CARE_PROVIDER_SITE_OTHER): Admitting: *Deleted

## 2024-01-05 DIAGNOSIS — Z23 Encounter for immunization: Secondary | ICD-10-CM | POA: Diagnosis not present

## 2024-01-08 DIAGNOSIS — C44311 Basal cell carcinoma of skin of nose: Secondary | ICD-10-CM | POA: Diagnosis not present

## 2024-01-08 DIAGNOSIS — X32XXXD Exposure to sunlight, subsequent encounter: Secondary | ICD-10-CM | POA: Diagnosis not present

## 2024-01-08 DIAGNOSIS — L718 Other rosacea: Secondary | ICD-10-CM | POA: Diagnosis not present

## 2024-01-08 DIAGNOSIS — L570 Actinic keratosis: Secondary | ICD-10-CM | POA: Diagnosis not present

## 2024-01-23 ENCOUNTER — Ambulatory Visit (INDEPENDENT_AMBULATORY_CARE_PROVIDER_SITE_OTHER): Payer: Medicare Other

## 2024-01-23 VITALS — BP 123/71 | HR 79 | Ht 61.0 in | Wt 156.0 lb

## 2024-01-23 DIAGNOSIS — Z0000 Encounter for general adult medical examination without abnormal findings: Secondary | ICD-10-CM

## 2024-01-23 NOTE — Progress Notes (Signed)
 Subjective:   Alison White is a 68 y.o. female who presents for a Medicare Annual Wellness Visit. I connected with  Estela LOISE Lot on 01/23/24 by a audio enabled telemedicine application and verified that I am speaking with the correct person using two identifiers.  Patient Location: Home  Provider Location: Office/Clinic  I discussed the limitations of evaluation and management by telemedicine. The patient expressed understanding and agreed to proceed.   Allergies (verified) Lexapro  [escitalopram  oxalate]   History: Past Medical History:  Diagnosis Date   Anxiety    Past Surgical History:  Procedure Laterality Date   ABDOMINAL HYSTERECTOMY     APPENDECTOMY     CHOLECYSTECTOMY     KNEE CARTILAGE SURGERY Left    Family History  Problem Relation Age of Onset   Alzheimer's disease Mother    Cancer Father        prostate   Social History   Occupational History   Not on file  Tobacco Use   Smoking status: Former    Current packs/day: 0.00    Types: Cigarettes    Quit date: 03/18/1997    Years since quitting: 26.8   Smokeless tobacco: Never  Vaping Use   Vaping status: Never Used  Substance and Sexual Activity   Alcohol use: Yes    Comment: Drinks 4 beer daily.   Drug use: No   Sexual activity: Not on file   Tobacco Counseling Counseling given: Yes  SDOH Screenings   Food Insecurity: No Food Insecurity (01/23/2024)  Housing: Unknown (01/23/2024)  Transportation Needs: No Transportation Needs (01/23/2024)  Utilities: Not At Risk (01/23/2024)  Alcohol Screen: Low Risk  (01/22/2023)  Depression (PHQ2-9): Low Risk  (01/23/2024)  Financial Resource Strain: Low Risk  (01/22/2023)  Physical Activity: Insufficiently Active (01/23/2024)  Social Connections: Moderately Isolated (01/23/2024)  Stress: No Stress Concern Present (01/23/2024)  Tobacco Use: Medium Risk (01/23/2024)  Health Literacy: Adequate Health Literacy (01/23/2024)   Depression Screen    01/23/2024    10:10 AM 12/15/2023    1:50 PM 07/25/2023    9:10 AM 06/13/2023    8:22 AM 01/22/2023   10:36 AM 12/16/2022    3:35 PM 07/12/2022    7:57 AM  PHQ 2/9 Scores  PHQ - 2 Score 0 0 0 0 0 0 0  PHQ- 9 Score 0 0    0  0       Data saved with a previous flowsheet row definition     Goals Addressed             This Visit's Progress    DIET - EAT MORE FRUITS AND VEGETABLES   On track      Visit info / Clinical Intake: Medicare Wellness Visit Type:: Subsequent Annual Wellness Visit Medicare Wellness Visit Mode:: Telephone If telephone:: video declined If telephone or video:: vitals recorded from last visit Interpreter Needed?: No Pre-visit prep was completed: yes AWV questionnaire completed by patient prior to visit?: no Living arrangements:: (!) lives alone Patient's Overall Health Status Rating: very good Typical amount of pain: none Does pain affect daily life?: no Are you currently prescribed opioids?: no  Dietary Habits and Nutritional Risks How many meals a day?: 3 Eats fruit and vegetables daily?: yes Most meals are obtained by: preparing own meals Diabetic:: no  Functional Status Activities of Daily Living (to include ambulation/medication): Independent Ambulation: Independent Medication Administration: Independent Home Management: Independent Manage your own finances?: yes Primary transportation is: driving Concerns about hearing?: no  Fall Screening Falls in the past year?: 0 Number of falls in past year: 0 Was there an injury with Fall?: 0 Fall Risk Category Calculator: 0 Patient Fall Risk Level: Low Fall Risk  Fall Risk Patient at Risk for Falls Due to: No Fall Risks Fall risk Follow up: Falls evaluation completed; Education provided  Home and Transportation Safety: All rugs have non-skid backing?: yes All stairs or steps have railings?: yes Grab bars in the bathtub or shower?: yes Have non-skid surface in bathtub or shower?: yes Good home lighting?:  yes Regular seat belt use?: yes  Cognitive Assessment Difficulty concentrating, remembering, or making decisions? : no Will 6CIT or Mini Cog be Completed: yes What year is it?: 0 points What month is it?: 0 points Give patient an address phrase to remember (5 components): [address] [place], [place] About what time is it?: 0 points Count backwards from 20 to 1: 0 points Say the months of the year in reverse: 0 points Repeat the address phrase from earlier: 0 points 6 CIT Score: 0 points  Advance Directives (For Healthcare) Does Patient Have a Medical Advance Directive?: No Would patient like information on creating a medical advance directive?: -- (pt aware can pick up info at pcp's office)  Reviewed/Updated  Reviewed/Updated: All; Medical History; Surgical History; Family History; Medications; Allergies; Care Teams; Patient Goals        Objective:    Today's Vitals   01/23/24 1004  BP: 123/71  Pulse: 79  Weight: 156 lb (70.8 kg)  Height: 5' 1 (1.549 m)   Body mass index is 29.48 kg/m.  Current Medications (verified) Outpatient Encounter Medications as of 01/23/2024  Medication Sig   busPIRone  (BUSPAR ) 7.5 MG tablet Take 1 tablet (7.5 mg total) by mouth 3 (three) times daily as needed.   meloxicam  (MOBIC ) 7.5 MG tablet Take 1 tablet (7.5 mg total) by mouth daily.   pantoprazole  (PROTONIX ) 20 MG tablet Take 2 tablets (40 mg total) by mouth daily.   rosuvastatin  (CRESTOR ) 10 MG tablet Take 1 tablet (10 mg total) by mouth daily.   No facility-administered encounter medications on file as of 01/23/2024.   Hearing/Vision screen Hearing Screening - Comments:: Pt denies hearing dif Vision Screening - Comments:: Pt wear glasses/pt goes [facility] [place] ov 29yrs ago Immunizations and Health Maintenance Health Maintenance  Topic Date Due   COVID-19 Vaccine (4 - 2025-26 season) 11/17/2023   Medicare Annual Wellness (AWV)  01/22/2025   Mammogram  04/17/2025   DEXA SCAN   06/12/2025   DTaP/Tdap/Td (2 - Td or Tdap) 11/14/2027   Colonoscopy  08/18/2031   Pneumococcal Vaccine: 50+ Years  Completed   Influenza Vaccine  Completed   Hepatitis C Screening  Completed   Zoster Vaccines- Shingrix  Completed   Meningococcal B Vaccine  Aged Out        Assessment/Plan:  This is a routine wellness examination for Seth Ward.  Patient Care Team: Lavell Bari LABOR, FNP as PCP - General (Family Medicine)  I have personally reviewed and noted the following in the patient's chart:   Medical and social history Use of alcohol, tobacco or illicit drugs  Current medications and supplements including opioid prescriptions. Functional ability and status Nutritional status Physical activity Advanced directives List of other physicians Hospitalizations, surgeries, and ER visits in previous 12 months Vitals Screenings to include cognitive, depression, and falls Referrals and appointments  No orders of the defined types were placed in this encounter.  In addition, I  have reviewed and discussed with patient certain preventive protocols, quality metrics, and best practice recommendations. A written personalized care plan for preventive services as well as general preventive health recommendations were provided to patient.   Ozie Ned, CMA   01/23/2024   Return in 1 year (on 01/22/2025).  After Visit Summary: (MyChart) Due to this being a telephonic visit, the after visit summary with patients personalized plan was offered to patient via MyChart   Nurse Notes: n/a

## 2024-04-15 ENCOUNTER — Ambulatory Visit: Payer: Self-pay | Admitting: Family

## 2024-04-21 LAB — HM MAMMOGRAPHY

## 2024-04-23 ENCOUNTER — Encounter: Payer: Self-pay | Admitting: Family

## 2024-05-04 ENCOUNTER — Ambulatory Visit: Admitting: Family

## 2025-01-26 ENCOUNTER — Ambulatory Visit
